# Patient Record
Sex: Female | Born: 1935 | Race: White | Hispanic: No | Smoking: Never smoker
Health system: Southern US, Community
[De-identification: ages and names within clinical notes are randomized; demographics above are authoritative.]

## PROBLEM LIST (undated history)

## (undated) DIAGNOSIS — G8929 Other chronic pain: Secondary | ICD-10-CM

## (undated) DIAGNOSIS — K5900 Constipation, unspecified: Secondary | ICD-10-CM

## (undated) DIAGNOSIS — F419 Anxiety disorder, unspecified: Secondary | ICD-10-CM

## (undated) DIAGNOSIS — M549 Dorsalgia, unspecified: Secondary | ICD-10-CM

## (undated) DIAGNOSIS — K219 Gastro-esophageal reflux disease without esophagitis: Secondary | ICD-10-CM

## (undated) HISTORY — PX: ABDOMINAL HYSTERECTOMY: SHX81

## (undated) HISTORY — PX: HERNIA REPAIR: SHX51

## (undated) HISTORY — DX: Other chronic pain: G89.29

## (undated) HISTORY — PX: HEMORRHOID SURGERY: SHX153

## (undated) HISTORY — DX: Gastro-esophageal reflux disease without esophagitis: K21.9

## (undated) HISTORY — PX: EYE SURGERY: SHX253

## (undated) HISTORY — DX: Anxiety disorder, unspecified: F41.9

## (undated) HISTORY — DX: Dorsalgia, unspecified: M54.9

## (undated) HISTORY — PX: LUMBAR DISC SURGERY: SHX700

---

## 2003-03-19 ENCOUNTER — Encounter: Payer: Self-pay | Admitting: Diagnostic Radiology

## 2003-03-19 ENCOUNTER — Encounter: Payer: Self-pay | Admitting: Neurosurgery

## 2003-03-19 ENCOUNTER — Encounter: Admission: RE | Admit: 2003-03-19 | Discharge: 2003-03-19 | Payer: Self-pay | Admitting: Neurosurgery

## 2003-04-05 ENCOUNTER — Encounter: Admission: RE | Admit: 2003-04-05 | Discharge: 2003-04-05 | Payer: Self-pay | Admitting: Neurosurgery

## 2003-04-05 ENCOUNTER — Encounter: Payer: Self-pay | Admitting: Neurosurgery

## 2003-07-06 ENCOUNTER — Encounter
Admission: RE | Admit: 2003-07-06 | Discharge: 2003-08-05 | Payer: Self-pay | Admitting: Physical Medicine and Rehabilitation

## 2004-12-05 ENCOUNTER — Encounter (INDEPENDENT_AMBULATORY_CARE_PROVIDER_SITE_OTHER): Payer: Self-pay | Admitting: *Deleted

## 2004-12-05 ENCOUNTER — Inpatient Hospital Stay (HOSPITAL_COMMUNITY): Admission: RE | Admit: 2004-12-05 | Discharge: 2004-12-18 | Payer: Self-pay | Admitting: Orthopedic Surgery

## 2004-12-12 ENCOUNTER — Ambulatory Visit: Payer: Self-pay | Admitting: Critical Care Medicine

## 2004-12-13 ENCOUNTER — Ambulatory Visit: Payer: Self-pay | Admitting: Physical Medicine & Rehabilitation

## 2004-12-18 ENCOUNTER — Inpatient Hospital Stay (HOSPITAL_COMMUNITY)
Admission: RE | Admit: 2004-12-18 | Discharge: 2004-12-28 | Payer: Self-pay | Admitting: Physical Medicine & Rehabilitation

## 2006-03-25 ENCOUNTER — Ambulatory Visit (HOSPITAL_COMMUNITY): Admission: RE | Admit: 2006-03-25 | Discharge: 2006-03-25 | Payer: Self-pay | Admitting: Ophthalmology

## 2007-11-19 HISTORY — PX: COLONOSCOPY: SHX174

## 2008-11-01 ENCOUNTER — Ambulatory Visit (HOSPITAL_COMMUNITY): Admission: RE | Admit: 2008-11-01 | Discharge: 2008-11-01 | Payer: Self-pay | Admitting: Ophthalmology

## 2009-05-09 ENCOUNTER — Ambulatory Visit (HOSPITAL_COMMUNITY): Admission: RE | Admit: 2009-05-09 | Discharge: 2009-05-09 | Payer: Self-pay | Admitting: Ophthalmology

## 2011-01-05 NOTE — Discharge Summary (Signed)
Belinda [name], Belinda [name] NO.:  [id number]   MEDICAL RECORD NO.:  [id number]          PATIENT TYPE:  INP   LOCATION:  5015                         FACILITY:  [facility]   PHYSICIAN:  Nelda Severe, MD      DATE OF BIRTH:  1927/02/17   DATE OF ADMISSION:  12/05/2004  DATE OF DISCHARGE:  12/18/2004                                 DISCHARGE SUMMARY   Patient was admitted for management of painful lumbar spondylosis associated  with spinal stenosis and scoliosis.  On the day of admission, she was taken  to the operating room where first stage L5-S1, L4-5 anterior inner body  fusion was carried out.  Postoperatively, her course was mildly complicated  by an ileus which resolved.  She was subsequently taken back to the  operating room one week later where a posterior instrumentation fusion was  performed from T10 to S1 with laminectomies, etc.  After that surgery, she  did satisfactorily, but was slow.  A consult was placed for inpatient rehab  and she was accepted in transfer.  She was transferred to rehab on Dec 18, 2004.  At that time, she was to be ambulating with a front-wheel walker,  weightbearing as tolerated.  Medications are to be determined by the  rehabilitation service.  Her wound was stable.  She is to be followed in the  office subsequent to her discharge from the rehabilitation service.       MT/MEDQ  D:  03/08/2005  T:  03/09/2005  Job:  [id number]

## 2011-01-05 NOTE — Op Note (Signed)
NAMEJIN, SHOCKLEY NO.:  [id number]   MEDICAL RECORD NO.:  [id number]          PATIENT TYPE:  INP   LOCATION:  2550                         FACILITY:  [facility]   PHYSICIAN:  Nelda Severe, MD      DATE OF BIRTH:  1926/11/15   DATE OF PROCEDURE:  12/05/2004  DATE OF DISCHARGE:                                 OPERATIVE REPORT   SURGEON:  Nelda Severe, M.D.   ASSISTANT:  Lynford Citizen, R.N.   PREOPERATIVE DIAGNOSIS:  Lumbar spondylosis, lumbar stenosis, lumbar  scoliosis.   POSTOPERATIVE DIAGNOSIS:  Lumbar spondylosis, lumbar stenosis, lumbar  scoliosis.   OPERATION:  First stage anterior interbody fusion at L4-5 and L5-S1  utilizing autograft, Synthes intravertebral cages, beta tricalcium  phosphate, and Synthes screws and washers; anterior iliac crest graft  harvest on left side.   OPERATIVE NOTE:  The patient was placed under general endotracheal  anesthesia. Sequential compression devices were attached to both lower  extremities. A Foley catheter was placed in the bladder. Preoperative Ancef  was infused intravenously.   The patient was positioned supine on a normal operating table with a bump  under the left hip to raise the left iliac crest. Arms were abducted and  placed on arm boards. The abdomen was prepped with Duraprep and draped in a  square fashion. The drapes were secured with Ioban.   An oblique incision was made from a point about 4 cm distal to the costal  margin proximally in oblique fashion to a point approximately 4 cm proximal  to the symphysis pubis and 3 cm lateral to the midline. Incision was carried  down onto the rectus sheath which was divided in a line with the incision.  The lateral edge of the rectus abdominus was mobilized. The arcuate line and  posterior rectus sheath were identified.   Blunt dissection was carried into the preperitoneal fat and then the  retroperitoneal fat distally to the arcuate line. Peritoneal sac was  then  bluntly dissected off the posterior rectus sheath. The posterior rectus  sheath was then incised approximately about 7 cm.   We then bluntly dissected and mobilized the peritoneal sac as well as the  left ureter which was visualized. The common iliac vessels were visualized  as well as the psoas muscle.   We then placed table mounted retractors (Synframe) and then bluntly  dissected soft tissue away from the sacral prominence within the bifurcation  of the great vessels. An 18-gauge needle was placed and proceeded to the L5-  S1 disk, and this was confirmed on a cross table lateral radiograph.   Next, after further mobilization of the right common iliac vessels,  retractors were placed to retain exposure of the L5-S1 annulus.   An annulectomy was performed and then disk enucleated using a combination of  curets and rongeurs. End-plate cartilage was curetted away from the inferior  end plate of L5 and superior end plate of S1. Trial spacers were placed and  a 17 spacer identified as being the best choice.   We then removed some of the retractors on  the left, exposed the anterior  iliac crest through a short incision. We windowed the top of the crest and  then using a combination of curets and a small gauge removed as much  cancellous bone as possible. We then mixed that bone with beta tricalcium  phosphate because we felt that there would not be enough bone for two  levels.   The wound was then irrigated. A slab of dry gel foam was placed in the  defect and the fascia closed using interrupted 0 Vicryl sutures.   Retractors were replaced at L5-S1. A 17-mm intervertebral spacer was loaded  with the mixture of bone and tricalcium phosphate and then impacted into  place. A Synthes 6.5-mm cancellous titanium screw with washer was then  inserted into the sacrum, placing it perpendicular to the floor so that the  washer would impinge upon the lower edge of the intervertebral  spacer.   Retractors were then removed and placed more proximally at the L5-S1 level.  The interval between the common iliac vessels and the psoas was blunted  dissected. A few small vessels were coagulated using bipolar coagulation.  The common iliac vessel was the mobilized to the right side and held with a  retractor blade to expose the anterior aspect of the L4-5 disk. The other  retractors were appropriately placed.   An annulectomy was then performed and the disk enucleated. This disk was  very narrow on the left side and of almost normal thickness on the right.  Therefore, the annulus was released on the right side to allow [place] to jack  it up.   Once we had removed the end-plate cartilage and enucleated the disk, a 15-mm  spacer seemed to best fit. This was purposely placed somewhat more to the  left than the right in order to correct the wedging of the disk space. It  was loaded with the remaining tricalcium phosphate and bone mixture and  impacted into place. Another door-stop screw was, again 3 mm in length,  6.5-mm cancellous fully threaded Synthes screw was placed proximally into  the L5 body so that the washer impinged upon the cage.   Cross table radiographs showed satisfactory position of implants. Closure  was then effected using continuous 2-0 Vicryl suture in the posterior rectus  sheath, continuous #1 Vicryl in the anterior rectus sheath, a combination of  interrupted and continuous 2-0 Vicryl suture in the subcutaneous layer, and  a subcuticular 3-0 Vicryl suture in the skin. A nonadherent antibiotic  ointment dressing was then applied to the wound. The closure of the iliac  crest bone graft harvest site was similar and antibiotic ointment dressing  applied there also.   The patient was moved to her bed and awakened. Upon dictation, she is being  taken to the recovery room.   There were no intraoperative complications. Sponge and needle counts were correct. The  blood estimated by the anesthesiology team at 400 cc.   Only 200 cc was sucked into the cell saver, so there was no cell saver blood  returned.    MT/MEDQ  D:  12/05/2004  T:  12/05/2004  Job:  [id number]

## 2011-01-05 NOTE — Op Note (Signed)
[name], [name] NO.:  [id number]   MEDICAL RECORD NO.:  [id number]          PATIENT TYPE:  INP   LOCATION:  2105                         FACILITY:  [facility]   PHYSICIAN:  Nelda Severe, MD      DATE OF BIRTH:  [birth year]   DATE OF PROCEDURE:  12/12/2004  DATE OF DISCHARGE:                                 OPERATIVE REPORT   SURGEON:  Nelda Severe, MD   ASSISTANT:  Belinda Merles, PA student.   PREOPERATIVE DIAGNOSES:  1.  Lumbar spondylosis.  2.  Lumbar stenosis.  3.  Lumbar degenerative scoliosis.  4.  Status post first stage anterior L4-5, L5-S1 fusion.   POSTOPERATIVE DIAGNOSES:  1.  Lumbar spondylosis.  2.  Lumbar stenosis.  3.  Lumbar degenerative scoliosis.  4.  Status post first stage anterior L4-5, L5-S1 fusion.   OPERATIVE PROCEDURE:  Posterior instrumentation and fusion T10 to S1using  Synthes Click-X instrumentation, autograft, and beta tricalcium phosphate;  right laminectomy at L2-3, bilateral laminectomies L3-4 and L4-5.   OPERATIVE NOTE:  The patient was placed under general endotracheal  anesthesia.  Foley catheter was already in the bladder.  A gram of  vancomycin was infused intravenously.  Bilateral sequential compression  devices were attached to both lower extremities.  Electrodes for neurologic  monitoring were attached.   She was positioned prone on four-poster Acromed frame.  Care was taken to  pad the upper extremities and shoulders to make sure that the shoulders were  not hyperabducted or hyperflexed and to make sure the elbows were not  hyperflexed.  The hips and knees were gently flexed and padded with pillows.   The lumbar area was prepped with DuraPrep and draped in rectangular fashion.  The drapes were secured with Ioban.   A midline incision was made from the lower thoracic spine to the sacrum.  The subcutaneous tissue was injected with a mixture of 0.25% Marcaine with  epinephrine and 1% plain lidocaine.   Incision was carried down into the spinous processes with a hot knife.  The  paraspinal muscles were mobilized with hot knife and Cobb elevators from T9  to S2.  Localizing lateral radiograph was taken with a Kocher on the spinous  process of L4.  It should be noted there were a great deal of problems  getting technically adequate x-rays because accommodation of the patient  being osteoporotic and her rather large girth.   The lumbar area was exposed bilaterally to the tips of the transverse  processes of L1 through L5 and the ala of the sacrum.   We then made pedicle holes bilaterally from S1 distally to T10 proximally.  Radiopaque markers were placed in each pedicle hole, and cross-table lateral  radiograph was taken.  This showed left side, 2-3 holes had been angulated  too per proximally, and this was corrected at the time the screws were  placed.  Each pedicle hole was carefully palpated with a ball tip probe to  make sure it was circumferentially intact and sounded for depth and the  depths recorded.  The holes were sealed  with FloSeal and bone wax around the  radiopaque marker.   Next we performed the laminectomy.  First we harvested bone graft from the  lamina using an osteotome.  Then the laminectomies were performed  bilaterally at L4 and L3 using a combination of Kerrison and Leksell  rongeurs.  Inferior facetectomies were carried out at L3 and L4.  Laminectomy was extended distally into L5 to a point approximately at the  level of approximately the mid pedicle level of L5.  The L5 nerve roots  could be palpated bilaterally descending into the neural foramen and did not  appear to be under undue tension.   Next, decortication of the ilio sacrum bilaterally and transverse processes  of L5, L4, L3, and L2 were carried out.  The bone graft which had been saved  from the laminectomy was mixed with 20 mL of beta tricalcium phosphate and  20 mL of bone marrow aspirate.  This was  then packed in bilaterally from L2  transverse process distally to the sacrum.   Actually prior to placing the bone graft, Click-X screws were placed  bilaterally at S1, L5, L4, L3, L2, L1, T11, and T12.  In the thoracic spine,  5.2 mm diameter screws were used, in the upper lumbar spine 6.2 mm diameter  screws were used and in the lower lumbar spine 7 mm screws.  On the right  side of the sacrum, 8 mm screw was used in the sacrum and on the left, a 7.2  mm screw.   We then measured, cut, and contoured titanium rods and attached them to the  screws.  Cross-table radiograph was taken.  Screws appeared in satisfactory  position.  It appeared on one of the x-rays as though one of the titanium  screws might be protruding into the T9-10 disk; it was removed, the hole  palpated and found not to be extending into the disk.  The screw was  reinserted.   The couplings were then torqued to the rods at each level .   The one-eighth inch Hemovac drain was placed in the deep depths of the wound  and brought out through the skin to the right side.  Closure was then  effected in the thoracolumbar fascia using continuous #1 Vicryl suture.  A  subcutaneous drain was placed, one-eighth inch Hemovac, brought out through  the skin to the right also.  Subcutaneous layer was closed using interrupted  2-0 inverted simple sutures.  Skin was closed using a subcuticular 3-0  undyed Vicryl and the skin edges reinforced with Steri-Strips.  Antibiotic  ointment dressing was applied and secured with OpSite.  Prior to placing the  dressing, each Hemovac drain was secured with a second basket-weave-type #2-  0 nylon suture.   The blood loss estimated at 2200 mL.  There was a significant amount of bone  bleeding throughout the procedure as well as epidural bleeding.  The patient  received 4 units of fresh frozen plasma, 1 unit of platelets, 1100 mL of Cell Saver blood, and 4 units of packed red blood cells.  At the  time of  closure, the active bleeding appeared to have stopped, and the blood in the  wound was coagulating satisfactorily.   The patient was then taken off the table and placed in her bed.  She was  extremely swollen around her face and scalp and as well, her abdomen was  quite distended, presumably from transudation into the peritoneal cavity.   She is  to remain intubated overnight and at the time of dictation has been  taken to the medical ICU, although I have not yet reexamined her there.   There were no intraoperative complications.  The sponge and needle counts  were correct.      MT/MEDQ  D:  12/12/2004  T:  12/12/2004  Job:  [id number]

## 2011-01-05 NOTE — Consult Note (Signed)
NAMEDOLOREZ, JEFFREY NO.:  [id number]   MEDICAL RECORD NO.:  [id number]          PATIENT TYPE:  INP   LOCATION:  2105                         FACILITY:  [facility]   PHYSICIAN:  Shan Levans, M.D. LHCDATE OF BIRTH:  September 19, 1926   DATE OF CONSULTATION:  12/12/2004  DATE OF DISCHARGE:                                   CONSULTATION   CHIEF COMPLAINT:  Evaluate for respiratory failure, status post surgery.   HISTORY OF PRESENT ILLNESS:  This 77 year old white female was brought in  electively for degenerative joint disease and a lumbar spondylosis, lumbar  stenosis, and scoliosis on December 05, 2004. Initially, she had an anterior  approach with first stage interbody fusion of L4/L5 and L5-S1 with Autograft  and intervertebral cages and screws. Then she had recovery from this and was  brought back to the OR today on December 12, 2004 and underwent posterior  fusion from T10 to S1 with laminectomy screws, rods, and Autograft using a  posterior approach. Postoperatively, she is on the ventilator. She has a  shocky blood pressures and low oxygen. We were asked to assist with ICU care  postoperatively.   PAST MEDICAL HISTORY:  1.  Hysterectomy.  2.  Cataracts.  3.  Degenerative joint disease.  4.  No other significant history and in particular no history of      cardiopulmonary disease.   MEDICATIONS:  Evista q.d. and aspirin 81 mg q.d.   PHYSICAL EXAMINATION:  VITAL SIGNS:  Blood pressure 80/palpable, temperature  100.3, pulse 85.  CHEST:  Expired wheezes. Poor air movement.  CARDIOVASCULAR:  Tachycardia without S3.  ABDOMEN:  Distended. Bowel sounds hypoactive.  EXTREMITIES:  Cool perfused.  SKIN:  Clear.  HEENT:  Orally intubated.  NECK:  No jugular venous distention.  No lymphadenopathy.   LABORATORY DATA:  A pH of 7.32, pCO2 of 53, pO2 160 on 100% with rate of 12,  PEEP of 5, tidal volume 550. Other labs are pending postoperatively. The  patient is on Diprivan  drip.   Chest x-ray shows atelectasis. Endotracheal tube is in satisfactory  position.   IMPRESSION:  Status post multi-staged posterior approach back fusion with  laminectomy screws and rods. Autograft placement with significant blood loss  with postoperative shock and respiratory failure. History of chronic back  disease, scoliosis.   RECOMMENDATIONS:  Administer full ventilatory support. Follow-up CBC, PTT,  and BNP. Give volume in the way of pack cells for now. Administer Diprivan  for sedation. Extubate in 24 hours if stabilizes over the night.      PW/MEDQ  D:  12/12/2004  T:  12/12/2004  Job:  [id number]   cc:   Nelda Severe, MD  Fax: [phone]   Patient's chart

## 2011-01-05 NOTE — Discharge Summary (Signed)
NAMECLEOPHA, INDELICATO NO.:  [id number]   MEDICAL RECORD NO.:  [id number]          PATIENT TYPE:  IPS   LOCATION:  4006                         FACILITY:  [facility]   PHYSICIAN:  Ellwood Dense, M.D.   DATE OF BIRTH:  11/23/1926   DATE OF ADMISSION:  12/18/2004  DATE OF DISCHARGE:  12/28/2004                                 DISCHARGE SUMMARY   DISCHARGE DIAGNOSES:  1.  Lumbar spondylosis with left scoliosis and L2 to L4 stenosis requiring      L2 to L4 posterior fusion and L4 to S1 anterior fusion.  2.  Postoperative anemia.  3.  Hypoalbuminemia.  4.  Urinary tract infection.  5.  Cellulitis, left lower abdomen, resolved.   HISTORY OF PRESENT ILLNESS:  Belinda [name] is a 77 year old female with history  of back and left lower extremity pain for the past 3 years with progression  and problems with ambulation.  She was noted to have lumbar spondylosis with  left scoliosis and L2 to L4 stenosis.  The patient elected to undergo L5 to  S1 fusion with autograft and Synthes cages, left iliac crest graft on December 05, 2004 by Dr. Carolynne Edouard.  Postop, noted to have anemia, requiring 3 units of  packed red blood cells.  On December 12, 2004, the patient underwent second  stage of surgery, requiring T10 to  S1 fusion with L3 to L5 laminectomies  with screws and rods, also by Dr. Carolynne Edouard.  Postop, patient with hypoxia and  hypotension due to shock, requiring intubation from December 12, 2004 to December 14, 2004.  She was extubated without difficulty.  Other issues have included  problems with electrolyte abnormality including hypokalemia as well as fluid  overload requiring treatment with diuresis.  She was also noted to have  ileus that resolved.  Foley discontinued on December 16, 2004 and patient noted  to be voiding without difficulty.  Therapies were initiated and patient  currently at mod-assist for bed mobility, min-assist for transfers, able to  min-assist, ambulating 100 feet, noted to have  decrease in endurance and  decrease in mobility.  She was setup assist for upper body care, max-assist  for lower body care.   PAST MEDICAL HISTORY:  Past medical history is significant for:  1.  DJD.  2.  DDD.  3.  Hysterectomy.  4.  Excision of bilateral cataracts.  5.  Excision of bunions.   ALLERGIES:  No known drug allergies.   FAMILY HISTORY:  Family history is positive for diabetes and cancer.   SOCIAL HISTORY:  The patient lives alone in a 1-level home with 1 step at  entry and was independent prior to admission.  She does not use any tobacco  or alcohol.   HOSPITAL COURSE:  Ms. Belinda [name] was admitted to rehab on Dec 18, 2004 for  inpatient therapies to consist of PT and OT daily.  Past admission, subcu  Lovenox was added for DVT prophylaxis secondary to the patient's poor  mobility.  The patient was noted to have a large back incision that was  noted  to be healing well.  She was noted to have 2 abdominal incisions in  left lower quadrant; the larger abdominal incision was noted to be indurated  with erythema and positive bloody drainage.  The patient was started on  Keflex for wound prophylaxis as question of early cellulitis.  The patient  was also started on iron supplements for postop anemia.  PVRs were checked  secondary to some complaints of frequency and the patient was noted to be  emptying without difficulty.  Her UA/UCS were done on Dec 19, 2004 showing  100,000 colonies, however, multi-species.  Labs done on May __________, 2006  revealed hemoglobin 9.1, hematocrit 26.4, white count 11.4, platelets  528,000.  Check of electrolytes revealed sodium of 139, potassium 3.2,  chloride 99, CO2 31, BUN 6, creatinine 0.8.  The patient was started on some  potassium supplement.  Recheck of electrolytes from Dec 26, 2004 shows  hypokalemia to have resolved with sodium 139, potassium 3.6, chloride 103,  CO2 28, BUN 6, creatinine 0.7, glucose 108.  Followup on postop  anemia  showed some improvement with hemoglobin at 9.4, hematocrit 26.0.  The  patient has been continent of bowel and bladder.  Pain control has been  reasonable with the use of p.r.n. oxycodone.  The patient was continued on  Keflex for 7 total days of treatment.  The patient's left lower quadrant  incision has healed well without any erythema.  She does continue with some  induration and some areas of ischemic tissue medially.  Repeat UA/UC were  done on Dec 25, 2004 and this grew out 40,000 colonies of E coli and Proteus,  patient to be treated with 3-day course of Cipro.  The patient has been  progressing along well during her stay.  She is currently at supervision  level for ambulating 250 feet, supervision to min-assist for transfers,  supervision to min-assist for toileting, modified independent for upper body  care, min-assist for lower body care.  She will continue to receive followup  home health PT by [facility] past discharge; family is to  assist as needed.  On Dec 28, 2004, the patient is discharged to home.   DISCHARGE MEDICATIONS:  1.  Nexium 40 mg a day.  2.  Senna-S two p.o. b.i.d.  3.  ProMod one scoop b.i.d.  4.  Trinsicon one p.o. b.i.d.  5.  Oxycodone IR 5 to 10 mg q.4-6 h. p.r.n. pain.  6.  MiraLax 17 g p.o. per day.   DIET:  Regular.   WOUND CARE:  Wash area with soap and water.  Keep clean and dry.   SPECIAL INSTRUCTIONS:  Routine back precautions.  Don brace at edge of bed.   FOLLOWUP:  The patient is to follow up with Dr. Carolynne Edouard in the next 1-2 week,  follow up with Dr. Chalmers Guest for routine care, follow up with Dr. Lamar Benes  as needed.      PP/MEDQ  D:  12/27/2004  T:  12/27/2004  Job:  [id number]   cc:   Dr. Virginia Rochester S. Vernell Morgans, M.D.  [address], [address]  [place]  [place] [address]

## 2011-01-05 NOTE — Consult Note (Signed)
NAME:  KAMI, KUBE               ACCOUNT NO.:  [id number]   MEDICAL RECORD NO.:  [id number]          PATIENT TYPE:  INP   LOCATION:  5009                         FACILITY:  [facility]   PHYSICIAN:  Theone Stanley, MD   DATE OF BIRTH:  [birth year]   DATE OF CONSULTATION:  DATE OF DISCHARGE:                                   CONSULTATION   REASON FOR CONSULTATION:  Help with medical management.   REQUESTING PHYSICIAN:  Nelda Severe, M.D.   CHIEF COMPLAINT:  Back pain.   Ms. Gilcrest is a very pleasant 77 year old female with minimal medical  problems presenting with increasing back pain and inability to walk.  She  has had back pain for the past 13 years; however, it has become  progressively worse over the past three years to the point where she can  only walk 100 feet.  She has tried conservative management, including  injections, but this has not helped.  In addition, she had symptoms of left  leg numbness for the past couple of years.  Because of this, she was  referred to the orthopedic physician, Dr. Alveda Reasons, and because she is in good  health with minimal medical problems, it was decided to proceed with her  surgery.  The patient recently had a first-stage anterior interbody fusion  at L4-5 and L5-S1 utilizing autograft, intervertebral cages.  This was  performed on April 18.  She is subsequently going to have further surgery  tomorrow on the 25th by Dr. Alveda Reasons, the second stage of her surgery.  Postoperatively it was complicated by bleeding to a hemoglobin of 7.9 and  also ileus secondary to narcotics.  These issues have pretty much resolved;  however, because of these postoperative complications, Dr. Alveda Reasons felt it  would be pertinent to get general medicine involved to help with her  postoperative management.  Currently the patient is doing well.  She has no  complaints.  Her pain is well-managed on Percocet.   PHYSICAL EXAMINATION:  VITAL SIGNS:  Temperature of 98.6, pulse of  105,  respirations 20, systolic blood pressure of 145/75, saturating 98% on room  air.  HEENT:  Head atraumatic, normocephalic.  Eyes 3 mm, pupils reactive to  light.  Extraocular movements intact.  Ears with no discharge.  Throat  clear. mucosa moist.  NECK:  Supple, no lymphadenopathy, no JVD.  CARDIAC:  Regular rate and rhythm.  No murmurs, rubs or gallops heard.  CHEST:  Lungs clear to auscultation bilaterally.  ABDOMEN:  Soft, nontender, nondistended.  EXTREMITIES:  No edema, cyanosis or clubbing.  NEUROLOGIC:  Patient alert and oriented x3.  Strength 5/5.  SKIN:  No rashes.  GENITOURINARY:  Deferred.   LABORATORY DATA:  A white count of 8, hemoglobin of 10, hematocrit 31,  platelets at 286.  INR 1.3.  Sodium 137, potassium of 3.2, chloride at 105,  CO2 of 25, glucose at 112, BUN at 5, creatinine at 0.8, calcium at 7.5.   ASSESSMENT AND PLAN:  Ms. Calip is a 77 year old Caucasian female  presenting to the hospital for a multistage surgery for spinal  stenosis.  Tomorrow will be her second stage.   1.  Anemia, and this is secondary to her recent surgery.  She has been      transfused, and she is ready for her surgery tomorrow.  Will continue to      watch this postoperatively.  2.  Hypokalemia.  The patient is receiving supplementation for this, and      will watch this with a repeat BMET tomorrow.  3.  Postoperative ileus.  This is definitely secondary to narcotics.  Things      to do would be decrease the dosage or consider alternative like Nubain      or Ultram.      AEJ/MEDQ  D:  12/11/2004  T:  12/12/2004  Job:  [id number]   cc:   [address]  [place]  [place] [address]  Fax: [phone]   Nelda Severe, MD  Fax: [phone]

## 2012-11-26 ENCOUNTER — Encounter (INDEPENDENT_AMBULATORY_CARE_PROVIDER_SITE_OTHER): Payer: Self-pay | Admitting: Surgery

## 2012-11-27 ENCOUNTER — Other Ambulatory Visit (INDEPENDENT_AMBULATORY_CARE_PROVIDER_SITE_OTHER): Payer: Self-pay | Admitting: Surgery

## 2012-11-27 ENCOUNTER — Ambulatory Visit (INDEPENDENT_AMBULATORY_CARE_PROVIDER_SITE_OTHER): Payer: Medicare Other | Admitting: Surgery

## 2012-11-27 ENCOUNTER — Encounter (INDEPENDENT_AMBULATORY_CARE_PROVIDER_SITE_OTHER): Payer: Self-pay | Admitting: Surgery

## 2012-11-27 VITALS — BP 128/80 | HR 68 | Temp 98.2°F | Resp 14 | Ht 68.0 in | Wt 168.4 lb

## 2012-11-27 DIAGNOSIS — K4090 Unilateral inguinal hernia, without obstruction or gangrene, not specified as recurrent: Secondary | ICD-10-CM

## 2012-11-27 DIAGNOSIS — K439 Ventral hernia without obstruction or gangrene: Secondary | ICD-10-CM | POA: Insufficient documentation

## 2012-11-27 DIAGNOSIS — R1012 Left upper quadrant pain: Secondary | ICD-10-CM

## 2012-11-27 NOTE — Progress Notes (Signed)
Patient ID: Belinda [name], female   DOB: 09-22-1926, 79 y.o.   MRN: [id number]  Chief Complaint  Patient presents with  . Hernia    HPI Belinda [name] is a 79 y.o. female.  Referred by Dr. Samuel Jester for left inguinal hernia HPI This is an 79 year old female in recently good health who is status post extensive spinal surgery by Dr. Alveda Reasons about 8 years ago. She had a posterior approach as well as a anterior retroperitoneal approach through a left lower quadrant incision. She has a previous midline incision from a hysterectomy. Over the last several months she has developed a fairly large bulge in her left lower quadrant. This has caused some discomfort and some difficulties with bowel movements. This area remains reducible when she is supine. She was examined by Dr. Charm Barges who referred her for possible left inguinal hernia.   PMH GERD, Chronic back pain   Past Surgical History  Procedure Laterality Date  . Hernia repair    . Lumbar disc surgery    . Abdominal hysterectomy    . Cesarean section    . Hemorrhoid surgery      History reviewed. No pertinent family history.  Social History History  Substance Use Topics  . Smoking status: Never Smoker   . Smokeless tobacco: Not on file  . Alcohol Use: No    No Known Allergies  Current Outpatient Prescriptions  Medication Sig Dispense Refill  . celecoxib (CELEBREX) 200 MG capsule Take 200 mg by mouth 2 (two) times daily.      Marland Kitchen esomeprazole (NEXIUM) 40 MG capsule Take 40 mg by mouth daily before breakfast.      . lactulose (CEPHULAC) 20 G packet Take 20 g by mouth 3 (three) times daily.      Marland Kitchen LORazepam (ATIVAN) 0.5 MG tablet Take 0.5 mg by mouth every 8 (eight) hours.      Marland Kitchen oxyCODONE-acetaminophen (PERCOCET) 10-325 MG per tablet Take 1 tablet by mouth every 4 (four) hours as needed for pain.      Marland Kitchen rOPINIRole (REQUIP) 0.5 MG tablet Take 0.5 mg by mouth 3 (three) times daily.      Marland Kitchen zolpidem (AMBIEN) 10 MG tablet Take 10 mg  by mouth at bedtime as needed for sleep.       No current facility-administered medications for this visit.    Review of Systems Review of Systems  Constitutional: Negative for fever, chills and unexpected weight change.  HENT: Negative for hearing loss, congestion, sore throat, trouble swallowing and voice change.   Eyes: Negative for visual disturbance.  Respiratory: Negative for cough and wheezing.   Cardiovascular: Negative for chest pain, palpitations and leg swelling.  Gastrointestinal: Positive for abdominal pain, constipation and abdominal distention. Negative for nausea, vomiting, diarrhea, blood in stool and anal bleeding.  Genitourinary: Negative for hematuria, vaginal bleeding and difficulty urinating.  Musculoskeletal: Negative for arthralgias.  Skin: Negative for rash and wound.  Neurological: Negative for seizures, syncope and headaches.  Hematological: Negative for adenopathy. Does not bruise/bleed easily.  Psychiatric/Behavioral: Negative for confusion.    Blood pressure 128/80, pulse 68, temperature 98.2 F (36.8 C), temperature source Temporal, resp. rate 14, height 5\' 8"  (1.727 m), weight 168 lb 6.4 oz (76.386 kg).  Physical Exam Physical Exam WDWN in NAD HEENT:  EOMI, sclera anicteric Neck:  No masses, no thyromegaly Lungs:  CTA bilaterally; normal respiratory effort CV:  Regular rate and rhythm; no murmurs Abd:  +bowel sounds, soft, healed lower midline and  LLQ oblique incision.  Visible palpable bulge between the incisions.  Easily reducible; no palpable fascial edges Ext:  Well-perfused; no edema Skin:  Warm, dry; no sign of jaundice  Data Reviewed none  Assessment    LLQ abdominal wall bulge - possible ventral hernia vs laxity of abdominal musculature secondary to nerve or blood supply disruption.     Plan    CT scan of the abdomen and pelvis with contrast to evaluate abdominal wall - hernia vs denervated muscle Will reevaluate after  scan Recommend abdominal binder for comfort        Evellyn Tuff K. 11/27/2012, 9:34 AM

## 2012-12-05 ENCOUNTER — Ambulatory Visit
Admission: RE | Admit: 2012-12-05 | Discharge: 2012-12-05 | Disposition: A | Discharge status: Home / Self Care | Payer: Medicare Other | Source: Ambulatory Visit | Attending: Surgery | Admitting: Surgery

## 2012-12-05 DIAGNOSIS — R1012 Left upper quadrant pain: Secondary | ICD-10-CM

## 2012-12-05 DIAGNOSIS — K4090 Unilateral inguinal hernia, without obstruction or gangrene, not specified as recurrent: Secondary | ICD-10-CM

## 2012-12-05 MED ORDER — IOHEXOL 300 MG/ML  SOLN
100.0000 mL | Freq: Once | INTRAMUSCULAR | Status: AC | PRN
  Administered 2012-12-05: 100 mL via INTRAVENOUS

## 2012-12-15 ENCOUNTER — Encounter (INDEPENDENT_AMBULATORY_CARE_PROVIDER_SITE_OTHER): Payer: Medicare Other | Admitting: Surgery

## 2012-12-30 ENCOUNTER — Encounter (INDEPENDENT_AMBULATORY_CARE_PROVIDER_SITE_OTHER): Payer: Self-pay | Admitting: Surgery

## 2012-12-30 ENCOUNTER — Ambulatory Visit (INDEPENDENT_AMBULATORY_CARE_PROVIDER_SITE_OTHER): Payer: Medicare Other | Admitting: Surgery

## 2012-12-30 VITALS — BP 137/84 | HR 76 | Temp 98.1°F | Resp 16 | Ht 68.0 in | Wt 165.8 lb

## 2012-12-30 DIAGNOSIS — R1900 Intra-abdominal and pelvic swelling, mass and lump, unspecified site: Secondary | ICD-10-CM | POA: Insufficient documentation

## 2012-12-30 NOTE — Progress Notes (Signed)
The patient returns for followup of her CT scan. This showed no abnormality of the internal organs. She does have chronic atrophy of her left rectus abdominous muscle with bulging of the musculature in her left lower quadrant. There is no sign of ventral hernia in this area. She does have a small inguinal hernia on the left but this is not the area of discomfort.    The patient has been wearing an abdominal binder and really likes however feels. It is supporting her abdominal wall as well as her back. She does have some chronic constipation from her chronic Percocet use.  There are no surgical remedies for the atrophic muscle. She is doing well with the abdominal binder and should continue this. She may use a stool softener as needed for constipation. She already does take lactulose daily. She is due for a colonoscopy soon. Followup as needed.  Belinda [name]. Corliss Skains, MD, [facility]  General/ Trauma Surgery  12/30/2012 9:39 AM

## 2013-12-10 ENCOUNTER — Encounter: Payer: Self-pay | Admitting: *Deleted

## 2014-01-13 ENCOUNTER — Ambulatory Visit (INDEPENDENT_AMBULATORY_CARE_PROVIDER_SITE_OTHER): Payer: Medicare Other | Admitting: Gastroenterology

## 2014-01-13 ENCOUNTER — Encounter (INDEPENDENT_AMBULATORY_CARE_PROVIDER_SITE_OTHER): Payer: Self-pay

## 2014-01-13 ENCOUNTER — Encounter: Payer: Self-pay | Admitting: Gastroenterology

## 2014-01-13 VITALS — BP 124/74 | HR 74 | Temp 97.3°F | Ht 68.0 in | Wt 173.0 lb

## 2014-01-13 DIAGNOSIS — K5900 Constipation, unspecified: Secondary | ICD-10-CM | POA: Insufficient documentation

## 2014-01-13 LAB — CBC
HEMATOCRIT: 36 %
HGB: 12 g/dL

## 2014-01-13 MED ORDER — LUBIPROSTONE 24 MCG PO CAPS: 24.0000 ug | ORAL_CAPSULE | Freq: Two times a day (BID) | ORAL | Status: DC

## 2014-01-13 NOTE — Patient Instructions (Signed)
I would like for you to start taking Amitiza 1 gelcap each evening WITH FOOD for 3 nights. If you do well with this, increase to twice a day WITH FOOD. It must be taken with food to avoid nausea.   I will see you back in 3 months to see how you are doing! Let me know if you have any concerns in the meantime.

## 2014-01-13 NOTE — Assessment & Plan Note (Signed)
80 year old female with chronic constipation and very scant hematochezia in the setting of known internal hemorrhoids; last colonoscopy in 2009 by Dr. Anthony Sar without polyps. Patient and daughter both deny any family history of colon cancer. She is quite healthy at her age; daughter desires a conservative approach currently. I feel it is definitely appropriate to proceed with treatment of constipation and monitoring for any worsening of symptoms or further hematochezia. If no improvement, consider colonoscopy. Otherwise, will continue to monitor and treat with bowel regimen. Constipation likely secondary to opiod-induced.   Amitiza 24 mcg po BID  Return in 3 months Consider colonoscopy if no improvement

## 2014-01-13 NOTE — Progress Notes (Signed)
cc'd to pcp 

## 2014-01-13 NOTE — Progress Notes (Signed)
Primary Care Physician:  Octavio Graves, DO Primary Gastroenterologist:  Dr. Oneida Alar   Chief Complaint  Patient presents with  . Colonoscopy    HPI:   Belinda [name] presents today at the request of Dr. Melina Copa to discuss possibly colonoscopy. Last colonoscopy in 2009 with internal and external hemorrhoids, otherwise normal. First ever. There was a question of possible family history of colon cancer, but both patient and daughter deny this. They state patient's mother had stomach cancer, but no one had ever had colon cancer in their family.  Sometimes scant hematochezia with wiping. Notes chronic constipation. Has cramping/pain in lower abdomen after taking Lactulose. Takes every other day. Takes Lactulose and dulcolax for constipation. No other prescription medications for constipation. No unexplained weight loss. Appetite not the best for 9 years since back surgery. States she is in constant pain from back issues. No upper GI concerns. Daughter does not want patient to pursue colonoscopy if not necessary.   Past Medical History  Diagnosis Date  . GERD (gastroesophageal reflux disease)   . Chronic back pain   . Anxiety     Past Surgical History  Procedure Laterality Date  . Hernia repair    . Lumbar disc surgery    . Abdominal hysterectomy    . Cesarean section    . Hemorrhoid surgery    . Colonoscopy  April 2009    Dr. Anthony Sar: internal and external hemorrhoids, otherwise normal    Current Outpatient Prescriptions  Medication Sig Dispense Refill  . Cholecalciferol (VITAMIN D-3) 5000 UNITS TABS Take 5,000 Units by mouth daily.      Marland Kitchen esomeprazole (NEXIUM) 40 MG capsule Take 40 mg by mouth daily before breakfast.      . lactulose (CEPHULAC) 20 G packet Take 20 g by mouth as needed.       Marland Kitchen LORazepam (ATIVAN) 0.5 MG tablet Take 0.5 mg by mouth every 8 (eight) hours.      Marland Kitchen oxyCODONE-acetaminophen (PERCOCET) 10-325 MG per tablet Take 1 tablet by mouth every 4 (four) hours  as needed for pain.      Marland Kitchen zolpidem (AMBIEN) 10 MG tablet Take 10 mg by mouth at bedtime as needed for sleep.      Marland Kitchen lubiprostone (AMITIZA) 24 MCG capsule Take 1 capsule (24 mcg total) by mouth 2 (two) times daily with a meal.  60 capsule  3   No current facility-administered medications for this visit.    Allergies as of 01/13/2014  . (No Known Allergies)    Family History  Problem Relation Age of Onset  . Colon cancer Neg Hx   . Stomach cancer Mother     History   Social History  . Marital Status: Widowed    Spouse Name: N/A    Number of Children: N/A  . Years of Education: N/A   Occupational History  . Not on file.   Social History Main Topics  . Smoking status: Never Smoker   . Smokeless tobacco: Current User  . Alcohol Use: No  . Drug Use: No  . Sexual Activity: Not on file   Other Topics Concern  . Not on file   Social History Narrative  . No narrative on file    Review of Systems: Gen: see HPI CV: Denies chest pain, heart palpitations, peripheral edema, syncope.  Resp: Denies shortness of breath at rest or with exertion. Denies wheezing or cough.  GI: see HPI GU : Denies urinary burning, urinary frequency,  urinary hesitancy MS: +chronic back pain Derm: Denies rash, itching, dry skin Psych: +anxiety Heme: Denies bruising, bleeding, and enlarged lymph nodes.  Physical Exam: BP 124/74  Pulse 74  Temp(Src) 97.3 F (36.3 C) (Oral)  Ht 5\' 8"  (1.727 m)  Wt 173 lb (78.472 kg)  BMI 26.31 kg/m2 General:   Alert and oriented. Pleasant and cooperative. Well-nourished and well-developed.  Head:  Normocephalic and atraumatic. Eyes:  Without icterus, sclera clear and conjunctiva pink.  Ears:  Normal auditory acuity. Nose:  No deformity, discharge,  or lesions. Mouth:  No deformity or lesions, oral mucosa pink.  Lungs:  Clear to auscultation bilaterally. No wheezes, rales, or rhonchi. No distress.  Heart:  S1, S2 present without murmurs appreciated.    Abdomen:  +BS, soft, non-tender and non-distended. No HSM noted. Right lower quadrant ventral hernia noted from prior anterior approach for lumbar surgery Rectal:  Deferred  Msk:  Symmetrical without gross deformities. Normal posture. Extremities:  Without clubbing or edema. Neurologic:  Alert and  oriented x4;  grossly normal neurologically. Skin:  Intact without significant lesions or rashes. Psych:  Alert and cooperative. Normal mood and affect.   Outside labs April 2015:  Hgb 12, Hct 36.4, LFTs normal

## 2014-01-27 ENCOUNTER — Telehealth: Payer: Self-pay | Admitting: Gastroenterology

## 2014-01-27 MED ORDER — LINACLOTIDE 145 MCG PO CAPS: 145.0000 ug | ORAL_CAPSULE | Freq: Every day | ORAL | Status: DC

## 2014-01-27 NOTE — Telephone Encounter (Signed)
Pt seen AS recently and said that the medicine that she put her on is making her sick and she needs to speak with AS or nurse ASAP. Please advise! [phone]

## 2014-01-27 NOTE — Addendum Note (Signed)
Addended by: Orvil Feil on: 01/27/2014 01:33 PM   Modules accepted: Orders

## 2014-01-27 NOTE — Telephone Encounter (Signed)
I called and told pt. She does not want samples , she wants the prescription for Linzess sent to her pharmacy. She said she will start it and call next week and let us know how she is doing.

## 2014-01-27 NOTE — Telephone Encounter (Signed)
Insurance may require a PA, but I'm not sure. I sent it in. If it does, may need to start on samples in the meantime.

## 2014-01-27 NOTE — Telephone Encounter (Signed)
Likely med effect from [place]. Stop Amitiza.  Let's trial Linzess 145 mcg daily. May cause diarrhea. PR early next week.

## 2014-03-23 ENCOUNTER — Encounter (INDEPENDENT_AMBULATORY_CARE_PROVIDER_SITE_OTHER): Payer: Self-pay

## 2014-03-23 ENCOUNTER — Ambulatory Visit (INDEPENDENT_AMBULATORY_CARE_PROVIDER_SITE_OTHER): Payer: Medicare Other | Admitting: Gastroenterology

## 2014-03-23 ENCOUNTER — Encounter: Payer: Self-pay | Admitting: Gastroenterology

## 2014-03-23 VITALS — BP 128/73 | HR 82 | Temp 97.0°F | Resp 18 | Ht 68.0 in | Wt 175.0 lb

## 2014-03-23 DIAGNOSIS — K921 Melena: Secondary | ICD-10-CM

## 2014-03-23 DIAGNOSIS — K5900 Constipation, unspecified: Secondary | ICD-10-CM

## 2014-03-23 MED ORDER — PEG 3350-KCL-NA BICARB-NACL 420 G PO SOLR: 4000.0000 mL | ORAL | Status: DC

## 2014-03-23 MED ORDER — POLYETHYLENE GLYCOL 3350 17 GM/SCOOP PO POWD: ORAL | Status: DC

## 2014-03-23 NOTE — Patient Instructions (Signed)
Take 1 capful of Miralax each evening, mixed with 8 ounces of water for constipation. I have sent this to the pharmacy. Make sure to ask them if it is cheaper to get this over the counter OR with a prescription.   We have scheduled you for a colonoscopy with Dr. Oneida Alar in the near future.   Further recommendations to follow!

## 2014-03-23 NOTE — Progress Notes (Signed)
Referring Provider: Octavio Graves, DO Primary Care Physician:  Octavio Graves, DO Primary GI: Dr. Oneida Alar   Chief Complaint  Patient presents with  . Follow-up    HPI:   Belinda [name] is a very vibrant 80 year old female with a history of chronic constipation and recently intermittent hematochezia. Last colonoscopy in 2009 by Dr. Anthony Sar with internal and external hemorrhoids, otherwise normal.  Sometimes has to sit in a hot pan of water because rectum hurts. Can't tolerate Linzess. Amitiza caused N/V. No further rectal bleeding. Takes Lactulose as needed. Would like a different regimen. Interested in pursuing colonoscopy due to rectal bleeding and constipation.     Past Medical History  Diagnosis Date  . GERD (gastroesophageal reflux disease)   . Chronic back pain   . Anxiety     Past Surgical History  Procedure Laterality Date  . Hernia repair    . Lumbar disc surgery    . Abdominal hysterectomy    . Cesarean section    . Hemorrhoid surgery    . Colonoscopy  April 2009    Dr. Anthony Sar: internal and external hemorrhoids, otherwise normal    Current Outpatient Prescriptions  Medication Sig Dispense Refill  . Cholecalciferol (VITAMIN D-3) 5000 UNITS TABS Take 5,000 Units by mouth daily.      Marland Kitchen esomeprazole (NEXIUM) 40 MG capsule Take 40 mg by mouth daily before breakfast.      . lactulose (CEPHULAC) 20 G packet Take 20 g by mouth as needed.       Marland Kitchen LORazepam (ATIVAN) 0.5 MG tablet Take 0.5 mg by mouth every 8 (eight) hours.      Marland Kitchen oxyCODONE-acetaminophen (PERCOCET) 10-325 MG per tablet Take 1 tablet by mouth every 4 (four) hours as needed for pain.      Marland Kitchen zolpidem (AMBIEN) 10 MG tablet Take 10 mg by mouth at bedtime as needed for sleep.       No current facility-administered medications for this visit.    Allergies as of 03/23/2014  . (No Known Allergies)    Family History  Problem Relation Age of Onset  . Colon cancer Neg Hx   . Stomach cancer Mother      History   Social History  . Marital Status: Widowed    Spouse Name: N/A    Number of Children: N/A  . Years of Education: N/A   Social History Main Topics  . Smoking status: Never Smoker   . Smokeless tobacco: Current User  . Alcohol Use: No  . Drug Use: No  . Sexual Activity: None   Other Topics Concern  . None   Social History Narrative  . None    Review of Systems: Negative unless mentioned in HPI.  Physical Exam: BP 128/73  Pulse 82  Temp(Src) 97 F (36.1 C) (Oral)  Resp 18  Ht 5\' 8"  (1.727 m)  Wt 175 lb (79.379 kg)  BMI 26.61 kg/m2 General:   Alert and oriented. No distress noted. Pleasant and cooperative.  Head:  Normocephalic and atraumatic. Eyes:  Conjuctiva clear without scleral icterus. Mouth:  Oral mucosa pink and moist. Good dentition. No lesions. Heart:  S1, S2 present without murmurs Abdomen:  +BS, soft, non-tender and non-distended. No rebound or guarding. LLQ ventral hernia Rectal: small external hemorrhoids noted. Internal exam without obvious mass. ?coccyx prominent in posterior rectum  Extremities:  Without edema. Neurologic:  Alert and  oriented x4;  grossly normal neurologically. Skin:  Intact without significant lesions or rashes. Psych:  Alert and cooperative. Normal mood and affect.

## 2014-03-24 NOTE — Assessment & Plan Note (Signed)
Intermittent in the setting of chronic constipation. Rectal exam without obvious mass. Query prominent coccyx posterior rectum. Last colonoscopy in 2009. Patient desires further evaluation due to rectal bleeding and persistent constipation. Although she is 45, she is quite vibrant and healthy. Will proceed with lower GI evaluation.   Proceed with colonoscopy with Dr. Oneida Alar in the near future. The risks, benefits, and alternatives have been discussed in detail with the patient. They state understanding and desire to proceed.

## 2014-03-24 NOTE — Assessment & Plan Note (Signed)
Unable to tolerate Linzess or Amitiza. Lactulose not as effective as desired. Will now trial Miralax each evening.

## 2014-03-25 NOTE — Progress Notes (Signed)
Cc to PCP 

## 2014-04-01 ENCOUNTER — Encounter (HOSPITAL_COMMUNITY): Payer: Self-pay | Admitting: Pharmacy Technician

## 2014-04-02 ENCOUNTER — Encounter (HOSPITAL_COMMUNITY): Payer: Self-pay | Admitting: Pharmacy Technician

## 2014-04-16 ENCOUNTER — Ambulatory Visit (HOSPITAL_COMMUNITY)
Admission: RE | Admit: 2014-04-16 | Discharge: 2014-04-16 | Disposition: A | Discharge status: Home / Self Care | Payer: Medicare Other | Source: Ambulatory Visit | Attending: Gastroenterology | Admitting: Gastroenterology

## 2014-04-16 ENCOUNTER — Encounter (HOSPITAL_COMMUNITY): Payer: Self-pay | Admitting: *Deleted

## 2014-04-16 ENCOUNTER — Other Ambulatory Visit: Payer: Self-pay | Admitting: Gastroenterology

## 2014-04-16 ENCOUNTER — Encounter (HOSPITAL_COMMUNITY)
Admission: RE | Disposition: A | Discharge status: Home / Self Care | Payer: Self-pay | Source: Ambulatory Visit | Attending: Gastroenterology

## 2014-04-16 ENCOUNTER — Telehealth: Payer: Self-pay | Admitting: Gastroenterology

## 2014-04-16 DIAGNOSIS — M549 Dorsalgia, unspecified: Secondary | ICD-10-CM | POA: Diagnosis not present

## 2014-04-16 DIAGNOSIS — K921 Melena: Secondary | ICD-10-CM

## 2014-04-16 DIAGNOSIS — Y929 Unspecified place or not applicable: Secondary | ICD-10-CM | POA: Diagnosis not present

## 2014-04-16 DIAGNOSIS — K219 Gastro-esophageal reflux disease without esophagitis: Secondary | ICD-10-CM | POA: Diagnosis not present

## 2014-04-16 DIAGNOSIS — Z79899 Other long term (current) drug therapy: Secondary | ICD-10-CM | POA: Diagnosis not present

## 2014-04-16 DIAGNOSIS — T183XXA Foreign body in small intestine, initial encounter: Secondary | ICD-10-CM | POA: Insufficient documentation

## 2014-04-16 DIAGNOSIS — K624 Stenosis of anus and rectum: Secondary | ICD-10-CM

## 2014-04-16 DIAGNOSIS — K922 Gastrointestinal hemorrhage, unspecified: Secondary | ICD-10-CM | POA: Insufficient documentation

## 2014-04-16 DIAGNOSIS — F411 Generalized anxiety disorder: Secondary | ICD-10-CM | POA: Diagnosis not present

## 2014-04-16 DIAGNOSIS — K648 Other hemorrhoids: Secondary | ICD-10-CM | POA: Diagnosis not present

## 2014-04-16 DIAGNOSIS — K5900 Constipation, unspecified: Secondary | ICD-10-CM | POA: Diagnosis not present

## 2014-04-16 DIAGNOSIS — T184XXA Foreign body in colon, initial encounter: Secondary | ICD-10-CM

## 2014-04-16 DIAGNOSIS — D126 Benign neoplasm of colon, unspecified: Secondary | ICD-10-CM | POA: Diagnosis not present

## 2014-04-16 DIAGNOSIS — G8929 Other chronic pain: Secondary | ICD-10-CM | POA: Insufficient documentation

## 2014-04-16 DIAGNOSIS — K5730 Diverticulosis of large intestine without perforation or abscess without bleeding: Secondary | ICD-10-CM | POA: Diagnosis not present

## 2014-04-16 DIAGNOSIS — IMO0002 Reserved for concepts with insufficient information to code with codable children: Secondary | ICD-10-CM | POA: Insufficient documentation

## 2014-04-16 HISTORY — PX: COLONOSCOPY: SHX5424

## 2014-04-16 HISTORY — DX: Constipation, unspecified: K59.00

## 2014-04-16 SURGERY — COLONOSCOPY
Anesthesia: Moderate Sedation

## 2014-04-16 MED ORDER — MEPERIDINE HCL 100 MG/ML IJ SOLN
INTRAMUSCULAR | Status: DC
Start: 2014-04-16 — End: 2014-04-16
  Filled 2014-04-16: qty 2

## 2014-04-16 MED ORDER — MEPERIDINE HCL 100 MG/ML IJ SOLN
INTRAMUSCULAR | Status: DC | PRN
  Administered 2014-04-16 (×3): 25 mg via INTRAVENOUS

## 2014-04-16 MED ORDER — SODIUM CHLORIDE 0.9 % IV SOLN
INTRAVENOUS | Status: DC
  Administered 2014-04-16: 08:00:00 via INTRAVENOUS

## 2014-04-16 MED ORDER — MIDAZOLAM HCL 5 MG/5ML IJ SOLN
INTRAMUSCULAR | Status: AC
  Filled 2014-04-16: qty 10

## 2014-04-16 MED ORDER — POLYETHYLENE GLYCOL 3350 17 GM/SCOOP PO POWD: ORAL | Status: DC

## 2014-04-16 MED ORDER — MIDAZOLAM HCL 5 MG/5ML IJ SOLN
INTRAMUSCULAR | Status: DC | PRN
  Administered 2014-04-16: 2 mg via INTRAVENOUS
  Administered 2014-04-16 (×2): 1 mg via INTRAVENOUS
  Administered 2014-04-16 (×2): 2 mg via INTRAVENOUS

## 2014-04-16 MED ORDER — STERILE WATER FOR IRRIGATION IR SOLN
Status: DC | PRN
  Administered 2014-04-16: 09:00:00

## 2014-04-16 NOTE — Telephone Encounter (Signed)
Referral has been made to Dr. Jenkins 

## 2014-04-16 NOTE — H&P (Signed)
  Primary Care Physician:  Octavio Graves, DO Primary Gastroenterologist:  Dr. Oneida Alar  Pre-Procedure History & Physical: HPI:  Belinda [name] is a 80 y.o. female here for  BRBPR.  Past Medical History  Diagnosis Date  . GERD (gastroesophageal reflux disease)   . Chronic back pain   . Anxiety   . Constipation     Past Surgical History  Procedure Laterality Date  . Hernia repair    . Lumbar disc surgery    . Abdominal hysterectomy    . Cesarean section    . Hemorrhoid surgery    . Colonoscopy  April 2009    Dr. Anthony Sar: internal and external hemorrhoids, otherwise normal    Prior to Admission medications   Medication Sig Start Date End Date Taking? Authorizing Provider  Cholecalciferol (VITAMIN D-3) 5000 UNITS TABS Take 5,000 Units by mouth daily.   Yes Historical Provider, MD  esomeprazole (NEXIUM) 40 MG capsule Take 40 mg by mouth daily before breakfast.   Yes Historical Provider, MD  gabapentin (NEURONTIN) 100 MG capsule Take 100 mg by mouth 2 (two) times daily.   Yes Historical Provider, MD  LORazepam (ATIVAN) 0.5 MG tablet Take 0.5 mg by mouth every 8 (eight) hours.   Yes Historical Provider, MD  oxyCODONE-acetaminophen (PERCOCET) 10-325 MG per tablet Take 1 tablet by mouth every 4 (four) hours as needed for pain.   Yes Historical Provider, MD  polyethylene glycol powder (GLYCOLAX/MIRALAX) powder Take 1 capful daily each evening for constipation. 03/23/14  Yes Orvil Feil, NP  polyethylene glycol-electrolytes (TRILYTE) 420 G solution Take 4,000 mLs by mouth as directed. 03/23/14  Yes Danie Binder, MD  zolpidem (AMBIEN) 10 MG tablet Take 10 mg by mouth at bedtime.    Yes Historical Provider, MD    Allergies as of 03/23/2014  . (No Known Allergies)    Family History  Problem Relation Age of Onset  . Colon cancer Neg Hx   . Stomach cancer Mother     History   Social History  . Marital Status: Widowed    Spouse Name: N/A    Number of Children: N/A  . Years of  Education: N/A   Occupational History  . Not on file.   Social History Main Topics  . Smoking status: Never Smoker   . Smokeless tobacco: Current User  . Alcohol Use: No  . Drug Use: No  . Sexual Activity: Not on file   Other Topics Concern  . Not on file   Social History Narrative  . No narrative on file    Review of Systems: See HPI, otherwise negative ROS   Physical Exam: BP 158/77  Pulse 78  Temp(Src) 97.6 F (36.4 C) (Oral)  Resp 20  Ht 5\' 8"  (1.727 m)  Wt 175 lb (79.379 kg)  BMI 26.61 kg/m2  SpO2 96% General:   Alert,  pleasant and cooperative in NAD Head:  Normocephalic and atraumatic. Neck:  Supple; Lungs:  Clear throughout to auscultation.    Heart:  Regular rate and rhythm. Abdomen:  Soft, nontender and nondistended. Normal bowel sounds, without guarding, and without rebound.   Neurologic:  Alert and  oriented x4;  grossly normal neurologically.  Impression/Plan:    BRBPR  PLAN: TCS TODAY

## 2014-04-16 NOTE — Discharge Instructions (Signed)
You had 16 polyps removed. You have diverticulosis IN YOUR LEFT COLON.   FOLLOW A HIGH FIBER DIET. AVOID ITEMS THAT CAUSE BLOATING. SEE INFO BELOW.  YOUR BIOPSY RESULTS SHOULD BE BACK IN 7 DAYS.  Next colonoscopy in 1-3 years with an overtube.   Colonoscopy Care After Read the instructions outlined below and refer to this sheet in the next week. These discharge instructions provide you with general information on caring for yourself after you leave the hospital. While your treatment has been planned according to the most current medical practices available, unavoidable complications occasionally occur. If you have any problems or questions after discharge, call DR. Aury Scollard, [phone].  ACTIVITY  You may resume your regular activity, but move at a slower pace for the next 24 hours.   Take frequent rest periods for the next 24 hours.   Walking will help get rid of the air and reduce the bloated feeling in your belly (abdomen).   No driving for 24 hours (because of the medicine (anesthesia) used during the test).   You may shower.   Do not sign any important legal documents or operate any machinery for 24 hours (because of the anesthesia used during the test).    NUTRITION  Drink plenty of fluids.   You may resume your normal diet as instructed by your doctor.   Begin with a light meal and progress to your normal diet. Heavy or fried foods are harder to digest and may make you feel sick to your stomach (nauseated).   Avoid alcoholic beverages for 24 hours or as instructed.    MEDICATIONS  You may resume your normal medications.   WHAT YOU CAN EXPECT TODAY  Some feelings of bloating in the abdomen.   Passage of more gas than usual.   Spotting of blood in your stool or on the toilet paper  .  IF YOU HAD POLYPS REMOVED DURING THE COLONOSCOPY:  Eat a soft diet IF YOU HAVE NAUSEA, BLOATING, ABDOMINAL PAIN, OR VOMITING.    FINDING OUT THE RESULTS OF YOUR TEST Not  all test results are available during your visit. DR. Oneida Alar WILL CALL YOU WITHIN 7 DAYS OF YOUR PROCEDUE WITH YOUR RESULTS. Do not assume everything is normal if you have not heard from DR. Uday Jantz IN ONE WEEK, CALL HER OFFICE AT [phone].  SEEK IMMEDIATE MEDICAL ATTENTION AND CALL THE OFFICE: [phone] IF:  You have more than a spotting of blood in your stool.   Your belly is swollen (abdominal distention).   You are nauseated or vomiting.   You have a temperature over 101F.   You have abdominal pain or discomfort that is severe or gets worse throughout the day.  Polyps, Colon  A polyp is extra tissue that grows inside your body. Colon polyps grow in the large intestine. The large intestine, also called the colon, is part of your digestive system. It is a long, hollow tube at the end of your digestive tract where your body makes and stores stool. Most polyps are not dangerous. They are benign. This means they are not cancerous. But over time, some types of polyps can turn into cancer. Polyps that are smaller than a pea are usually not harmful. But larger polyps could someday become or may already be cancerous. To be safe, doctors remove all polyps and test them.   WHO GETS POLYPS? Anyone can get polyps, but certain people are more likely than others. You may have a greater chance of getting polyps  if:  You are over 50.   You have had polyps before.   Someone in your family has had polyps.   Someone in your family has had cancer of the large intestine.   Find out if someone in your family has had polyps. You may also be more likely to get polyps if you:   Eat a lot of fatty foods   Smoke   Drink alcohol   Do not exercise  Eat too much   TREATMENT  The caregiver will remove the polyp during sigmoidoscopy or colonoscopy.  PREVENTION There is not one sure way to prevent polyps. You might be able to lower your risk of getting them if you:  Eat more fruits and vegetables  and less fatty food.   Do not smoke.   Avoid alcohol.   Exercise every day.   Lose weight if you are overweight.   Eating more calcium and folate can also lower your risk of getting polyps. Some foods that are rich in calcium are milk, cheese, and broccoli. Some foods that are rich in folate are chickpeas, kidney beans, and spinach.   High-Fiber Diet A high-fiber diet changes your normal diet to include more whole grains, legumes, fruits, and vegetables. Changes in the diet involve replacing refined carbohydrates with unrefined foods. The calorie level of the diet is essentially unchanged. The Dietary Reference Intake (recommended amount) for adult males is 38 grams per day. For adult females, it is 25 grams per day. Pregnant and lactating women should consume 28 grams of fiber per day. Fiber is the intact part of a plant that is not broken down during digestion. Functional fiber is fiber that has been isolated from the plant to provide a beneficial effect in the body. PURPOSE  Increase stool bulk.   Ease and regulate bowel movements.   Lower cholesterol.  INDICATIONS THAT YOU NEED MORE FIBER  Constipation and hemorrhoids.   Uncomplicated diverticulosis (intestine condition) and irritable bowel syndrome.   Weight management.   As a protective measure against hardening of the arteries (atherosclerosis), diabetes, and cancer.   GUIDELINES FOR INCREASING FIBER IN THE DIET  Start adding fiber to the diet slowly. A gradual increase of about 5 more grams (2 slices of whole-wheat bread, 2 servings of most fruits or vegetables, or 1 bowl of high-fiber cereal) per day is best. Too rapid an increase in fiber may result in constipation, flatulence, and bloating.   Drink enough water and fluids to keep your urine clear or pale yellow. Water, juice, or caffeine-free drinks are recommended. Not drinking enough fluid may cause constipation.   Eat a variety of high-fiber foods rather than one  type of fiber.   Try to increase your intake of fiber through using high-fiber foods rather than fiber pills or supplements that contain small amounts of fiber.   The goal is to change the types of food eaten. Do not supplement your present diet with high-fiber foods, but replace foods in your present diet.  INCLUDE A VARIETY OF FIBER SOURCES  Replace refined and processed grains with whole grains, canned fruits with fresh fruits, and incorporate other fiber sources. White rice, white breads, and most bakery goods contain little or no fiber.   Brown whole-grain rice, buckwheat oats, and many fruits and vegetables are all good sources of fiber. These include: broccoli, Brussels sprouts, cabbage, cauliflower, beets, sweet potatoes, white potatoes (skin on), carrots, tomatoes, eggplant, squash, berries, fresh fruits, and dried fruits.   Cereals appear  to be the richest source of fiber. Cereal fiber is found in whole grains and bran. Bran is the fiber-rich outer coat of cereal grain, which is largely removed in refining. In whole-grain cereals, the bran remains. In breakfast cereals, the largest amount of fiber is found in those with "bran" in their names. The fiber content is sometimes indicated on the label.   You may need to include additional fruits and vegetables each day.   In baking, for 1 cup white flour, you may use the following substitutions:   1 cup whole-wheat flour minus 2 tablespoons.   1/2 cup white flour plus 1/2 cup whole-wheat flour.   Diverticulosis Diverticulosis is a common condition that develops when small pouches (diverticula) form in the wall of the colon. The risk of diverticulosis increases with age. It happens more often in people who eat a low-fiber diet. Most individuals with diverticulosis have no symptoms. Those individuals with symptoms usually experience belly (abdominal) pain, constipation, or loose stools (diarrhea).  HOME CARE INSTRUCTIONS  Increase the  amount of fiber in your diet as directed by your caregiver or dietician. This may reduce symptoms of diverticulosis.   Drink at least 6 to 8 glasses of water each day to prevent constipation.   Try not to strain when you have a bowel movement.   Avoiding nuts and seeds to prevent complications is still an uncertain benefit.       FOODS HAVING HIGH FIBER CONTENT INCLUDE:  Fruits. Apple, peach, pear, tangerine, raisins, prunes.   Vegetables. Brussels sprouts, asparagus, broccoli, cabbage, carrot, cauliflower, romaine lettuce, spinach, summer squash, tomato, winter squash, zucchini.   Starchy Vegetables. Baked beans, kidney beans, lima beans, split peas, lentils, potatoes (with skin).   Grains. Whole wheat bread, brown rice, bran flake cereal, plain oatmeal, white rice, shredded wheat, bran muffins.    SEEK IMMEDIATE MEDICAL CARE IF:  You develop increasing pain or severe bloating.   You have an oral temperature above 101F.   You develop vomiting or bowel movements that are bloody or black.

## 2014-04-16 NOTE — Telephone Encounter (Signed)
PT NEEDS TO SEE DR. Arnoldo Morale FOR ANAL STENOSIS/DILATION/STOOL BEZOAR ASAP.

## 2014-04-19 ENCOUNTER — Ambulatory Visit (HOSPITAL_COMMUNITY)
Admission: RE | Admit: 2014-04-19 | Discharge: 2014-04-19 | Disposition: A | Discharge status: Home / Self Care | Payer: Medicare Other | Source: Ambulatory Visit | Attending: Ophthalmology | Admitting: Ophthalmology

## 2014-04-19 ENCOUNTER — Encounter (HOSPITAL_COMMUNITY): Payer: Self-pay | Admitting: *Deleted

## 2014-04-19 ENCOUNTER — Encounter (HOSPITAL_COMMUNITY)
Admission: RE | Disposition: A | Discharge status: Home / Self Care | Payer: Self-pay | Source: Ambulatory Visit | Attending: Ophthalmology

## 2014-04-19 DIAGNOSIS — K219 Gastro-esophageal reflux disease without esophagitis: Secondary | ICD-10-CM | POA: Insufficient documentation

## 2014-04-19 DIAGNOSIS — F411 Generalized anxiety disorder: Secondary | ICD-10-CM | POA: Insufficient documentation

## 2014-04-19 DIAGNOSIS — H409 Unspecified glaucoma: Secondary | ICD-10-CM | POA: Insufficient documentation

## 2014-04-19 DIAGNOSIS — M545 Low back pain, unspecified: Secondary | ICD-10-CM | POA: Diagnosis not present

## 2014-04-19 DIAGNOSIS — K625 Hemorrhage of anus and rectum: Secondary | ICD-10-CM | POA: Diagnosis present

## 2014-04-19 DIAGNOSIS — K5900 Constipation, unspecified: Secondary | ICD-10-CM | POA: Insufficient documentation

## 2014-04-19 DIAGNOSIS — Z90710 Acquired absence of both cervix and uterus: Secondary | ICD-10-CM | POA: Insufficient documentation

## 2014-04-19 HISTORY — PX: SLT LASER APPLICATION: SHX6099

## 2014-04-19 SURGERY — SLT LASER APPLICATION
Anesthesia: LOCAL | Laterality: Right

## 2014-04-19 MED ORDER — HYPROMELLOSE (GONIOSCOPIC) 2.5 % OP SOLN
OPHTHALMIC | Status: AC
  Filled 2014-04-19: qty 15

## 2014-04-19 MED ORDER — PILOCARPINE HCL 1 % OP SOLN
OPHTHALMIC | Status: AC
Start: 2014-04-19 — End: 2014-04-19
  Filled 2014-04-19: qty 15

## 2014-04-19 MED ORDER — APRACLONIDINE HCL 1 % OP SOLN
OPHTHALMIC | Status: AC
Start: 2014-04-19 — End: 2014-04-19
  Filled 2014-04-19: qty 0.1

## 2014-04-19 MED ORDER — TETRACAINE HCL 0.5 % OP SOLN
1.0000 [drp] | OPHTHALMIC | Status: DC | PRN
  Administered 2014-04-19: 1 [drp] via OPHTHALMIC

## 2014-04-19 MED ORDER — TETRACAINE HCL 0.5 % OP SOLN
OPHTHALMIC | Status: AC
  Filled 2014-04-19: qty 2

## 2014-04-19 MED ORDER — PILOCARPINE HCL 1 % OP SOLN
1.0000 [drp] | Freq: Once | OPHTHALMIC | Status: AC
  Administered 2014-04-19: 1 [drp] via OPHTHALMIC

## 2014-04-19 MED ORDER — APRACLONIDINE HCL 1 % OP SOLN
1.0000 [drp] | OPHTHALMIC | Status: AC
  Administered 2014-04-19 (×3): 1 [drp] via OPHTHALMIC

## 2014-04-19 MED ORDER — HYPROMELLOSE (GONIOSCOPIC) 2.5 % OP SOLN
1.0000 [drp] | Freq: Once | OPHTHALMIC | Status: AC
  Administered 2014-04-19: 1 [drp] via OPHTHALMIC

## 2014-04-19 NOTE — H&P (Signed)
I have reviewed the pre printed H&P, the patient was re-examined, and I have identified no significant interval changes in the patient's medical condition.  There is no change in the plan of care since the history and physical of record. 

## 2014-04-19 NOTE — Op Note (Signed)
[facility] [address] [place], [address]   COLONOSCOPY PROCEDURE REPORT  PATIENT: Belinda [name], Belinda [name]  MR#: [id number] BIRTHDATE: 02/10/1927 , 87  yrs. old GENDER: Female ENDOSCOPIST: Barney Drain, MD REFERRED BY/TO:Cynthia Satira Anis, M.D. PROCEDURE DATE:  04/16/2014 PROCEDURE:   Colonoscopy with cold biopsy polypectomy and Colonoscopy with snare polypectomy INDICATIONS:Rectal Bleeding. MEDICATIONS: Demerol 75 mg IV and Versed 8 mg IV  DESCRIPTION OF PROCEDURE:    Physical exam was performed.  Informed consent was obtained from the patient after explaining the benefits, risks, and alternatives to procedure.  The patient was connected to monitor and placed in left lateral position. Continuous oxygen was provided by nasal cannula and IV medicine administered through an indwelling cannula.  After administration of sedation and rectal exam, the patients rectum was intubated and the EC-3890Li ([id number]) and EG-[id number] ([id number])  colonoscope was advanced under direct visualization to the cecum.  The scope was removed slowly by carefully examining the color, texture, anatomy, and integrity mucosa on the way out.  The patient was recovered in endoscopy and discharged home in satisfactory condition.    COLON FINDINGS: A sessile polyp measuring 4 mm in size was found at the cecum.  A polypectomy was performed with cold forceps. Multiple polyps(15) measuring 6-8 mm in size were found at the cecum(6), in the ascending colon(3), transverse colon(5), and descending colon(2).  A polypectomy was performed using snare cautery.  , There was mild diverticulosis noted in the descending colon and sigmoid colon with associated muscular hypertrophy.  , Small internal hemorrhoids were found.  , and BEZOAR IN RECTUM. ATTEMPTED TO REDUCE IN SIZE VIA ENDOSCOPY AND RECTAL EXAM.  ANAL STENOSIS PRESENT.  PREP QUALITY: good.CECAL W/D TIME: 49 minutes      COMPLICATIONS: None  ENDOSCOPIC IMPRESSION: 1.   16 COLON POLYPS REMOVED 3.   Mild diverticulosis in the descending colon and sigmoid colon 4.   Small internal hemorrhoids 5.  BEZOAR IN RECTUM DUE TO ANAL STENOSIS.  RECOMMENDATIONS: FOLLOW A HIGH FIBER DIET.  AVOID ITEMS THAT CAUSE BLOATING.  SEE INFO BELOW. BIOPSY RESULTS SHOULD BE BACK IN 7 DAYS. Next colonoscopy in 1-3 years with an overtube. REFER TO DR. Arnoldo Morale FOR ANAL STENOSIS  ______________________________eSigned:  Barney Drain, MD 04/19/2014 3:21 PM

## 2014-04-19 NOTE — Op Note (Signed)
None required

## 2014-04-19 NOTE — Discharge Instructions (Signed)
Belinda [name]  04/19/2014     Instructions    Activity: No Restrictions.   Diet: Resume Diet you were on at home.   Pain Medication: Tylenol if Needed.   CONTACT YOUR DOCTOR IF YOU HAVE PAIN, REDNESS IN YOUR EYE, OR DECREASED VISION.   Follow-up:Sept 22th @ 11:15 am with Williams Che, MD.    Dr. Iona Hansen: [phone]     If you find that you cannot contact your physician, but feel that your signs and   Symptoms warrant a physician's attention, call the Emergency Room at   [phone] [phone].

## 2014-04-19 NOTE — Brief Op Note (Signed)
Belinda [name] 04/19/2014  Williams Che, MD  Pre-op Diagnosis:  uncontrolled glaucoma right eye  Post-op Diagnosis: * No post-op diagnosis entered *  Yag laser self-test completed: Yes.    Indications:  Uncontrolled glaucoma OD  Procedure: SLT right  eye  Eye protection worn by staff:  Yes.   Laser In Use sign on door:  Yes.    Laser:  Lumenis SLT  Power Setting:  0.9 mJ/burst Anatomical site treated:  Trabecular meshwork 360 deg Number of applications:  87 Total energy delivered: 78.3 mJ Results:  Gonioscopy OD:  Open 360 grade 4, no pigment, no synechiae, no recessions  The patient was discharged home with instructions to continue all her current glaucoma medications in the un-operated eye, and discontinue all her current glaucoma medications, if any.  Patient was instructed to go to the office, as previously scheduled, for intraocular pressure:  Yes.    Patient verbalizes understanding of discharge instructions:  Yes.

## 2014-04-29 NOTE — Telephone Encounter (Signed)
Called and informed pt. She said she really did like Dr. Oneida Alar and all of the staff. Everyone was really nice and she also wanted to let Dr. Oneida Alar know that she really did like Dr. Arnoldo Morale also.

## 2014-04-29 NOTE — Progress Notes (Signed)
REVIEWED-NO ADDITIONAL RECOMMENDATIONS. 

## 2014-04-29 NOTE — Telephone Encounter (Addendum)
Please call pt. She had simple adenomas removed.  FOLLOW A HIGH FIBER DIET. AVOID ITEMS THAT CAUSE BLOATING.  Next colonoscopy in 3 years with an overtube IF THE BENEFITS OUTWEIGH THE RISKS. ALL SISTERS, BROTHERS, AND CHILDREN NEED TO HAVE A COLONOSCOPY AFTER THE AGE OF 40.

## 2014-04-29 NOTE — Telephone Encounter (Signed)
REVIEWED. OPV IN 4 MOS E30 RECTAL BLEEDING/CONSTIPATION

## 2014-04-29 NOTE — Telephone Encounter (Signed)
NIC'D FOR OV AND 5 YR COLONOSCOPY

## 2014-08-16 ENCOUNTER — Encounter: Payer: Self-pay | Admitting: Gastroenterology

## 2014-09-06 ENCOUNTER — Encounter: Payer: Self-pay | Admitting: Gastroenterology

## 2014-10-05 ENCOUNTER — Ambulatory Visit: Payer: Medicare Other | Admitting: Gastroenterology

## 2014-10-05 ENCOUNTER — Telehealth: Payer: Self-pay | Admitting: General Practice

## 2014-10-05 ENCOUNTER — Encounter: Payer: Self-pay | Admitting: General Practice

## 2014-10-05 NOTE — Telephone Encounter (Signed)
I received a voicemail from a family member that the patient would like to reschedule her appointment.  I was unable to reach the patient by phone, so I rescheduled her appointment to 10/20/2014 at 2pm and mailed her a letter.

## 2014-10-20 ENCOUNTER — Ambulatory Visit: Payer: Medicare Other | Admitting: Nurse Practitioner

## 2015-10-01 NOTE — Progress Notes (Signed)
REVIEWED-NO ADDITIONAL RECOMMENDATIONS. 

## 2016-07-19 ENCOUNTER — Emergency Department (HOSPITAL_COMMUNITY): Payer: Medicare Other

## 2016-07-19 ENCOUNTER — Encounter (HOSPITAL_COMMUNITY): Payer: Self-pay | Admitting: Emergency Medicine

## 2016-07-19 ENCOUNTER — Emergency Department (HOSPITAL_COMMUNITY)
Admission: EM | Admit: 2016-07-19 | Discharge: 2016-07-19 | Disposition: A | Discharge status: Home / Self Care | Payer: Medicare Other | Attending: Emergency Medicine | Admitting: Emergency Medicine

## 2016-07-19 DIAGNOSIS — K5641 Fecal impaction: Secondary | ICD-10-CM

## 2016-07-19 DIAGNOSIS — K5900 Constipation, unspecified: Secondary | ICD-10-CM | POA: Insufficient documentation

## 2016-07-19 DIAGNOSIS — Z79899 Other long term (current) drug therapy: Secondary | ICD-10-CM | POA: Insufficient documentation

## 2016-07-19 DIAGNOSIS — K6289 Other specified diseases of anus and rectum: Secondary | ICD-10-CM

## 2016-07-19 DIAGNOSIS — F17200 Nicotine dependence, unspecified, uncomplicated: Secondary | ICD-10-CM | POA: Insufficient documentation

## 2016-07-19 LAB — CBC
HCT: 35.3 % — ABNORMAL LOW (ref 36.0–46.0)
HEMOGLOBIN: 11.1 g/dL — AB (ref 12.0–15.0)
MCH: 28.9 pg (ref 26.0–34.0)
MCHC: 31.4 g/dL (ref 30.0–36.0)
MCV: 91.9 fL (ref 78.0–100.0)
Platelets: 279 10*3/uL (ref 150–400)
RBC: 3.84 MIL/uL — AB (ref 3.87–5.11)
RDW: 13.5 % (ref 11.5–15.5)
WBC: 6 10*3/uL (ref 4.0–10.5)

## 2016-07-19 LAB — URINALYSIS, ROUTINE W REFLEX MICROSCOPIC
Bilirubin Urine: NEGATIVE
Glucose, UA: NEGATIVE mg/dL
Hgb urine dipstick: NEGATIVE
LEUKOCYTES UA: NEGATIVE
NITRITE: NEGATIVE
PH: 5 (ref 5.0–8.0)
Protein, ur: NEGATIVE mg/dL

## 2016-07-19 LAB — COMPREHENSIVE METABOLIC PANEL
ALT: 11 U/L — ABNORMAL LOW (ref 14–54)
ANION GAP: 7 (ref 5–15)
AST: 22 U/L (ref 15–41)
Albumin: 3.9 g/dL (ref 3.5–5.0)
Alkaline Phosphatase: 53 U/L (ref 38–126)
BILIRUBIN TOTAL: 0.3 mg/dL (ref 0.3–1.2)
BUN: 12 mg/dL (ref 6–20)
CHLORIDE: 101 mmol/L (ref 101–111)
CO2: 29 mmol/L (ref 22–32)
Calcium: 8.7 mg/dL — ABNORMAL LOW (ref 8.9–10.3)
Creatinine, Ser: 0.88 mg/dL (ref 0.44–1.00)
GFR calc Af Amer: >60 mL/min (ref >60)
GFR calc non Af Amer: 57 mL/min — ABNORMAL LOW (ref >60)
Glucose, Bld: 111 mg/dL — ABNORMAL HIGH (ref 65–99)
Potassium: 3.8 mmol/L (ref 3.5–5.1)
Sodium: 137 mmol/L (ref 135–145)
TOTAL PROTEIN: 7.1 g/dL (ref 6.5–8.1)

## 2016-07-19 LAB — POC OCCULT BLOOD, ED: Fecal Occult Bld: NEGATIVE

## 2016-07-19 LAB — LIPASE, BLOOD: Lipase: 25 U/L (ref 11–51)

## 2016-07-19 NOTE — ED Provider Notes (Signed)
[facility] DEPT Provider Note   CSN: [id number] Arrival date & time: 07/19/16  1445     History   Chief Complaint Chief Complaint  Patient presents with  . Rectal Pain  . Abdominal Pain    HPI Belinda [name] is a 82 y.o. female.  HPI  Pt was seen at [place]. Per pt, c/o gradual onset and persistence of constant rectal "fullness" for the past 1 week. Has been associated with LLQ pain. Denies N/V/D, no back pain, no dysuria, no black or blood in stools.    Past Medical History:  Diagnosis Date  . Anxiety   . Chronic back pain   . Constipation   . GERD (gastroesophageal reflux disease)     Patient Active Problem List   Diagnosis Date Noted  . Hematochezia 03/23/2014  . Unspecified constipation 01/13/2014  . Abdominal wall bulge secondary to atrophic muscle 12/30/2012    Past Surgical History:  Procedure Laterality Date  . ABDOMINAL HYSTERECTOMY    . CESAREAN SECTION    . COLONOSCOPY  April 2009   Dr. Anthony Sar: internal and external hemorrhoids, otherwise normal  . COLONOSCOPY N/A 04/16/2014   SLF:16 polyps removed/mild diverticulosis/small interal hemorrhoids/bezoar in rectum due to anal stenosis  . EYE SURGERY    . HEMORRHOID SURGERY    . HERNIA REPAIR    . LUMBAR DISC SURGERY    . SLT LASER APPLICATION Right [id number]   Procedure: SLT LASER APPLICATION;  Surgeon: Williams Che, MD;  Location: [facility];  Service: Ophthalmology;  Laterality: Right;    OB History    No data available       Home Medications    Prior to Admission medications   Medication Sig Start Date End Date Taking? Authorizing Provider  Cholecalciferol (VITAMIN D-3) 5000 UNITS TABS Take 5,000 Units by mouth daily.    Historical Provider, MD  esomeprazole (NEXIUM) 40 MG capsule Take 40 mg by mouth daily before breakfast.    Historical Provider, MD  gabapentin (NEURONTIN) 100 MG capsule Take 100 mg by mouth 2 (two) times daily.    Historical Provider, MD  LORazepam (ATIVAN) 0.5 MG  tablet Take 0.5 mg by mouth every 8 (eight) hours.    Historical Provider, MD  oxyCODONE-acetaminophen (PERCOCET) 10-325 MG per tablet Take 1 tablet by mouth every 4 (four) hours as needed for pain.    Historical Provider, MD  polyethylene glycol powder (GLYCOLAX/MIRALAX) powder Take 1 capful bid evening for constipation. 04/16/14   Danie Binder, MD  zolpidem (AMBIEN) 10 MG tablet Take 10 mg by mouth at bedtime.     Historical Provider, MD    Family History Family History  Problem Relation Age of Onset  . Stomach cancer Mother   . Colon cancer Neg Hx     Social History Social History  Substance Use Topics  . Smoking status: Never Smoker  . Smokeless tobacco: Current User  . Alcohol use No     Allergies   Patient has no known allergies.   Review of Systems Review of Systems ROS: Statement: All systems negative except as marked or noted in the HPI; Constitutional: Negative for fever and chills. ; ; Eyes: Negative for eye pain, redness and discharge. ; ; ENMT: Negative for ear pain, hoarseness, nasal congestion, sinus pressure and sore throat. ; ; Cardiovascular: Negative for chest pain, palpitations, diaphoresis, dyspnea and peripheral edema. ; ; Respiratory: Negative for cough, wheezing and stridor. ; ; Gastrointestinal: +rectal fullness, abd pain. Negative for nausea,  vomiting, diarrhea, blood in stool, hematemesis, jaundice and rectal bleeding. . ; ; Genitourinary: Negative for dysuria, flank pain and hematuria. ; ; Musculoskeletal: Negative for back pain and neck pain. Negative for swelling and trauma.; ; Skin: Negative for pruritus, rash, abrasions, blisters, bruising and skin lesion.; ; Neuro: Negative for headache, lightheadedness and neck stiffness. Negative for weakness, altered level of consciousness, altered mental status, extremity weakness, paresthesias, involuntary movement, seizure and syncope.       Physical Exam Updated Vital Signs BP 164/66 (BP Location: Left Arm)    Pulse 76   Temp 97.8 F (36.6 C) (Oral)   Resp 18   Ht 5\' 8"  (1.727 m)   Wt 162 lb (73.5 kg)   SpO2 99%   BMI 24.63 kg/m   Physical Exam 1840: Physical examination:  Nursing notes reviewed; Vital signs and O2 SAT reviewed;  Constitutional: Well developed, Well nourished, Well hydrated, In no acute distress; Head:  Normocephalic, atraumatic; Eyes: EOMI, PERRL, No scleral icterus; ENMT: Mouth and pharynx normal, Mucous membranes moist; Neck: Supple, Full range of motion, No lymphadenopathy; Cardiovascular: Regular rate and rhythm, No gallop; Respiratory: Breath sounds clear & equal bilaterally, No wheezes.  Speaking full sentences with ease, Normal respiratory effort/excursion; Chest: Nontender, Movement normal; Abdomen: Soft, +NT reducible hernia LLQ without overlying erythema or ecchymosis. Nontender, Nondistended, Normal bowel sounds. Rectal exam performed w/permission of pt and ED RN chaperone present.  Anal tone normal.  Non-tender, soft brown stool in rectal vault, heme neg. +fecal impaction.  No fissures, +external hemorrhoids without thrombosis or bleeding.;;; Genitourinary: No CVA tenderness; Extremities: Pulses normal, No tenderness, No edema, No calf edema or asymmetry.; Neuro: AA&Ox3, Major CN grossly intact.  Speech clear. No gross focal motor or sensory deficits in extremities. Climbs on and off stretcher easily by herself. Gait steady.; Skin: Color normal, Warm, Dry.   ED Treatments / Results  Labs (all labs ordered are listed, but only abnormal results are displayed)   EKG  EKG Interpretation None       Radiology   Procedures Procedures (including critical care time)  Medications Ordered in ED Medications - No data to display   Initial Impression / Assessment and Plan / ED Course  I have reviewed the triage vital signs and the nursing notes.  Pertinent labs & imaging results that were available during my care of the patient were reviewed by me and considered in  my medical decision making (see chart for details).  MDM Reviewed: previous chart, nursing note and vitals Reviewed previous: labs Interpretation: labs and x-ray    Results for orders placed or performed during the hospital encounter of 07/19/16  Lipase, blood  Result Value Ref Range   Lipase 25 11 - 51 U/L  Comprehensive metabolic panel  Result Value Ref Range   Sodium 137 135 - 145 mmol/L   Potassium 3.8 3.5 - 5.1 mmol/L   Chloride 101 101 - 111 mmol/L   CO2 29 22 - 32 mmol/L   Glucose, Bld 111 (H) 65 - 99 mg/dL   BUN 12 6 - 20 mg/dL   Creatinine, Ser 0.88 0.44 - 1.00 mg/dL   Calcium 8.7 (L) 8.9 - 10.3 mg/dL   Total Protein 7.1 6.5 - 8.1 g/dL   Albumin 3.9 3.5 - 5.0 g/dL   AST 22 15 - 41 U/L   ALT 11 (L) 14 - 54 U/L   Alkaline Phosphatase 53 38 - 126 U/L   Total Bilirubin 0.3 0.3 - 1.2 mg/dL  GFR calc non Af Amer 57 (L) >60 mL/min   GFR calc Af Amer >60 >60 mL/min   Anion gap 7 5 - 15  CBC  Result Value Ref Range   WBC 6.0 4.0 - 10.5 K/uL   RBC 3.84 (L) 3.87 - 5.11 MIL/uL   Hemoglobin 11.1 (L) 12.0 - 15.0 g/dL   HCT 35.3 (L) 36.0 - 46.0 %   MCV 91.9 78.0 - 100.0 fL   MCH 28.9 26.0 - 34.0 pg   MCHC 31.4 30.0 - 36.0 g/dL   RDW 13.5 11.5 - 15.5 %   Platelets 279 150 - 400 K/uL  Urinalysis, Routine w reflex microscopic  Result Value Ref Range   Color, Urine YELLOW YELLOW   APPearance CLEAR CLEAR   Specific Gravity, Urine >1.030 (H) 1.005 - 1.030   pH 5.0 5.0 - 8.0   Glucose, UA NEGATIVE NEGATIVE mg/dL   Hgb urine dipstick NEGATIVE NEGATIVE   Bilirubin Urine NEGATIVE NEGATIVE   Ketones, ur TRACE (A) NEGATIVE mg/dL   Protein, ur NEGATIVE NEGATIVE mg/dL   Nitrite NEGATIVE NEGATIVE   Leukocytes, UA NEGATIVE NEGATIVE  POC occult blood, ED  Result Value Ref Range   Fecal Occult Bld NEGATIVE NEGATIVE   Dg Abd Acute W/chest Result Date: 07/19/2016 CLINICAL DATA:  Patient with left lower quadrant pain and constipation for 2- 3 weeks. EXAM: DG ABDOMEN ACUTE W/ 1V  CHEST COMPARISON:  Chest radiograph 12/15/2004 ; CT abdomen pelvis 12/05/2012. FINDINGS: Stable cardiac and mediastinal contours. No consolidative pulmonary opacities. No pleural effusion or pneumothorax. Gas is demonstrated within nondilated loops of large and small bowel in a nonobstructed pattern. There is a large amount of stool within the rectum, transverse and descending colon. No evidence for free intraperitoneal air. Lumbar spinal fusion hardware. IMPRESSION: Large amount of stool within the distal transverse, descending colon and rectum, compatible with constipation. No acute cardiopulmonary process. Electronically Signed   By: Lovey Newcomer M.D.   On: 07/19/2016 19:17   2010:  Attempted disimpaction, pt then ambulatory with steady gait to the bathroom. Pt declines further disimpaction. Abd remains benign. Pt and family would like to go home now. Dx and testing d/w pt and family.  Questions answered.  Verb understanding, agreeable to d/c home with outpt f/u.    Final Clinical Impressions(s) / ED Diagnoses   Final diagnoses:  None    New Prescriptions New Prescriptions   No medications on file     Francine Graven, DO 07/23/16 2014

## 2016-07-19 NOTE — ED Triage Notes (Addendum)
Pt reports rectal pain x1 week and has hernia in lower left quadrant that is causing her pain.  Denies blood in stool at this time, unsure if she has hemorrhoids.  Pt denies n/v/d. Last bm yesterday.

## 2016-07-19 NOTE — ED Notes (Signed)
Patient transported to X-ray 

## 2016-07-19 NOTE — ED Notes (Signed)
Returned from XR 

## 2016-07-19 NOTE — Discharge Instructions (Signed)
Take over the counter laxative (such as miralax, milk of magnesia, senokot) AND a dulcolax suppository (or enema) today and repeat both tomorrow.  Begin to take over the counter stool softener (colace or miralax), as directed on packaging, for the next month, or for as long as you are taking narcotic pain medications.  Continue to take your usual prescriptions as previously directed.  Call your regular medical doctor tomorrow to schedule a follow up appointment within the next 3 days.  Return to the Emergency Department immediately if worsening.

## 2017-04-01 ENCOUNTER — Encounter: Payer: Self-pay | Admitting: Gastroenterology

## 2017-05-17 ENCOUNTER — Emergency Department (HOSPITAL_COMMUNITY)
Admission: EM | Admit: 2017-05-17 | Discharge: 2017-05-17 | Disposition: A | Discharge status: Home / Self Care | Payer: Medicare Other | Attending: Emergency Medicine | Admitting: Emergency Medicine

## 2017-05-17 ENCOUNTER — Encounter (HOSPITAL_COMMUNITY): Payer: Self-pay | Admitting: *Deleted

## 2017-05-17 DIAGNOSIS — K625 Hemorrhage of anus and rectum: Secondary | ICD-10-CM | POA: Diagnosis present

## 2017-05-17 DIAGNOSIS — F17220 Nicotine dependence, chewing tobacco, uncomplicated: Secondary | ICD-10-CM | POA: Insufficient documentation

## 2017-05-17 DIAGNOSIS — Z79899 Other long term (current) drug therapy: Secondary | ICD-10-CM | POA: Insufficient documentation

## 2017-05-17 DIAGNOSIS — D649 Anemia, unspecified: Secondary | ICD-10-CM | POA: Diagnosis not present

## 2017-05-17 DIAGNOSIS — K5903 Drug induced constipation: Secondary | ICD-10-CM | POA: Insufficient documentation

## 2017-05-17 LAB — CBC WITH DIFFERENTIAL/PLATELET
BASOS ABS: 0 10*3/uL (ref 0.0–0.1)
Basophils Relative: 0 %
EOS PCT: 0 %
Eosinophils Absolute: 0 10*3/uL (ref 0.0–0.7)
HCT: 35.5 % — ABNORMAL LOW (ref 36.0–46.0)
HEMOGLOBIN: 11.3 g/dL — AB (ref 12.0–15.0)
LYMPHS ABS: 1.4 10*3/uL (ref 0.7–4.0)
LYMPHS PCT: 25 %
MCH: 28.5 pg (ref 26.0–34.0)
MCHC: 31.8 g/dL (ref 30.0–36.0)
MCV: 89.4 fL (ref 78.0–100.0)
Monocytes Absolute: 0.4 10*3/uL (ref 0.1–1.0)
Monocytes Relative: 7 %
NEUTROS ABS: 3.9 10*3/uL (ref 1.7–7.7)
NEUTROS PCT: 68 %
PLATELETS: 279 10*3/uL (ref 150–400)
RBC: 3.97 MIL/uL (ref 3.87–5.11)
RDW: 13.9 % (ref 11.5–15.5)
WBC: 5.7 10*3/uL (ref 4.0–10.5)

## 2017-05-17 LAB — URINALYSIS, ROUTINE W REFLEX MICROSCOPIC
BILIRUBIN URINE: NEGATIVE
Glucose, UA: NEGATIVE mg/dL
Hgb urine dipstick: NEGATIVE
Ketones, ur: NEGATIVE mg/dL
Leukocytes, UA: NEGATIVE
NITRITE: NEGATIVE
Protein, ur: NEGATIVE mg/dL
Specific Gravity, Urine: 1.009 (ref 1.005–1.030)
pH: 6 (ref 5.0–8.0)

## 2017-05-17 LAB — HEPATIC FUNCTION PANEL
ALBUMIN: 3.6 g/dL (ref 3.5–5.0)
ALK PHOS: 60 U/L (ref 38–126)
ALT: 16 U/L (ref 14–54)
AST: 27 U/L (ref 15–41)
BILIRUBIN INDIRECT: 0.4 mg/dL (ref 0.3–0.9)
Bilirubin, Direct: 0.1 mg/dL (ref 0.1–0.5)
Total Bilirubin: 0.5 mg/dL (ref 0.3–1.2)
Total Protein: 6.8 g/dL (ref 6.5–8.1)

## 2017-05-17 LAB — BASIC METABOLIC PANEL
Anion gap: 10 (ref 5–15)
BUN: 17 mg/dL (ref 6–20)
CHLORIDE: 100 mmol/L — AB (ref 101–111)
CO2: 28 mmol/L (ref 22–32)
CREATININE: 0.98 mg/dL (ref 0.44–1.00)
Calcium: 8.9 mg/dL (ref 8.9–10.3)
GFR calc Af Amer: 58 mL/min — ABNORMAL LOW (ref >60)
GFR calc non Af Amer: 50 mL/min — ABNORMAL LOW (ref >60)
GLUCOSE: 140 mg/dL — AB (ref 65–99)
Potassium: 3.8 mmol/L (ref 3.5–5.1)
Sodium: 138 mmol/L (ref 135–145)

## 2017-05-17 LAB — TYPE AND SCREEN
ABO/RH(D): O POS
Antibody Screen: NEGATIVE

## 2017-05-17 MED ORDER — SENNA 8.6 MG PO TABS: 1.0000 | ORAL_TABLET | Freq: Every day | ORAL | 0 refills | Status: DC

## 2017-05-17 NOTE — ED Triage Notes (Signed)
Pt with abd pain for couple of weeks, gi bleeding started today and bright red in color. Denies nausea

## 2017-05-17 NOTE — ED Provider Notes (Signed)
[facility] DEPT Provider Note   CSN: [id number] Arrival date & time: 05/17/17  1120     History   Chief Complaint Chief Complaint  Patient presents with  . GI Bleeding    HPI Belinda [name] is a 83 y.o. female.  HPI  83 y/o female (pt of Dr. Oneida Alar) has had prior polyps but has not had problems with rectal bleeding - the patient does take 20mg  of oxycodone every 4 hours for chronic pain - has had Complaints of abdominal pain which is chronic, rectal pain which is chronic, states that she is chronically constipated and has recently been prescribed a medication by her physician to battle opioid-induced constipation. She reports that this morning she had a very small amount of bright red blood on the paper when she wiped, there was no other complaints of rectal bleeding. She does not remember the last time she had a normal stool, they're always hard and brown. She denies any urinary symptoms, denies any fevers chills nausea or vomiting.  Past Medical History:  Diagnosis Date  . Anxiety   . Chronic back pain   . Constipation   . GERD (gastroesophageal reflux disease)     Patient Active Problem List   Diagnosis Date Noted  . Hematochezia 03/23/2014  . Unspecified constipation 01/13/2014  . Abdominal wall bulge secondary to atrophic muscle 12/30/2012    Past Surgical History:  Procedure Laterality Date  . ABDOMINAL HYSTERECTOMY    . CESAREAN SECTION    . COLONOSCOPY  April 2009   Dr. Anthony Sar: internal and external hemorrhoids, otherwise normal  . COLONOSCOPY N/A 04/16/2014   [facility]:16 polyps removed/mild diverticulosis/small interal hemorrhoids/bezoar in rectum due to anal stenosis  . EYE SURGERY    . HEMORRHOID SURGERY    . HERNIA REPAIR    . LUMBAR DISC SURGERY    . SLT LASER APPLICATION Right 8/36/6294   Procedure: SLT LASER APPLICATION;  Surgeon: Williams Che, MD;  Location: [facility];  Service: Ophthalmology;  Laterality: Right;    OB History    No data  available       Home Medications    Prior to Admission medications   Medication Sig Start Date End Date Taking? Authorizing Provider  Cholecalciferol (VITAMIN D-3) 5000 UNITS TABS Take 5,000 Units by mouth daily.    [provider]  esomeprazole (NEXIUM) 40 MG capsule Take 40 mg by mouth daily before breakfast.    [provider]  gabapentin (NEURONTIN) 100 MG capsule Take 100 mg by mouth 2 (two) times daily.    [provider]  LORazepam (ATIVAN) 0.5 MG tablet Take 0.5 mg by mouth every 8 (eight) hours.    [provider]  oxyCODONE-acetaminophen (PERCOCET) 10-325 MG per tablet Take 1 tablet by mouth every 4 (four) hours as needed for pain.    [provider]  polyethylene glycol powder (GLYCOLAX/MIRALAX) powder Take 1 capful bid evening for constipation. 04/16/14   Fields, Marga Melnick, MD  zolpidem (AMBIEN) 10 MG tablet Take 10 mg by mouth at bedtime.     [provider]    Family History Family History  Problem Relation Age of Onset  . Stomach cancer Mother   . Colon cancer Neg Hx     Social History Social History  Substance Use Topics  . Smoking status: Never Smoker  . Smokeless tobacco: Current User  . Alcohol use No     Allergies   Patient has no known allergies.   Review of  Systems Review of Systems  All other systems reviewed and are negative.    Physical Exam Updated Vital Signs BP (!) 149/70 (BP Location: Right Arm)   Pulse 84   Temp (!) 97.5 F (36.4 C) (Oral)   Resp 16   Ht 5\' 8"  (1.727 m)   Wt 64.9 kg (143 lb)   SpO2 100%   BMI 21.74 kg/m   Physical Exam  Constitutional: She appears well-developed and well-nourished. No distress.  HENT:  Head: Normocephalic and atraumatic.  Mouth/Throat: Oropharynx is clear and moist. No oropharyngeal exudate.  Eyes: Pupils are equal, round, and reactive to light. Conjunctivae and EOM are normal. Right eye exhibits no discharge. Left eye exhibits no discharge.  No scleral icterus.  Neck: Normal range of motion. Neck supple. No JVD present. No thyromegaly present.  Cardiovascular: Normal rate, regular rhythm, normal heart sounds and intact distal pulses.  Exam reveals no gallop and no friction rub.   No murmur heard. Pulmonary/Chest: Effort normal and breath sounds normal. No respiratory distress. She has no wheezes. She has no rales.  Abdominal: Soft. Bowel sounds are normal. She exhibits no distension and no mass. There is tenderness ( mild diffuse ttp without guarding or peritoneal signs).  Genitourinary:  Genitourinary Comments: Rectal exam with chaperone present - there is no hemorrhoids, no fissues, no masses - in the rectal vault there is a very hard stool - no gross blood - this is faintly hemoccult positive on my exam but no BRB.  Musculoskeletal: Normal range of motion. She exhibits no edema or tenderness.  Lymphadenopathy:    She has no cervical adenopathy.  Neurological: She is alert. Coordination normal.  Skin: Skin is warm and dry. No rash noted. No erythema.  Psychiatric: She has a normal mood and affect. Her behavior is normal.  Nursing note and vitals reviewed.    ED Treatments / Results  Labs (all labs ordered are listed, but only abnormal results are displayed) Labs Reviewed  CBC WITH DIFFERENTIAL/PLATELET - Abnormal; Notable for the following:       Result Value   Hemoglobin 11.3 (*)    HCT 35.5 (*)    All other components within normal limits  BASIC METABOLIC PANEL  HEPATIC FUNCTION PANEL  TYPE AND SCREEN    Radiology No results found.  Procedures Procedures (including critical care time)  Medications Ordered in ED Medications - No data to display   Initial Impression / Assessment and Plan / ED Course  I have reviewed the triage vital signs and the nursing notes.  Pertinent labs & imaging results that were available during my care of the patient were reviewed by me and considered in my medical decision making  (see chart for details).     Bleeding is not necessarily from colon - check UA as well, chronic constipation - scheduled to see Dr. Oneida Alar in 1 month.  The labs are reassuring, Hgb is stable from 10 months ago at 11.3, UA is normal without hematuria and Liver function is normal,  Pt informed Chronic abd pain is likely due to constipation She states she is NOT taking her miralax,  Encouraged this as well as senna Pt and family in agreement.  Vitals:   05/17/17 1123 05/17/17 1145 05/17/17 1330  BP: (!) 149/70  131/70  Pulse: 84 73   Resp: 16 14 12   Temp: (!) 97.5 F (36.4 C)    TempSrc: Oral    SpO2: 100% 96%   Weight: 64.9 kg (143 lb)  Height: 5\' 8"  (1.727 m)        Final Clinical Impressions(s) / ED Diagnoses   Final diagnoses:  Drug-induced constipation  Mild anemia    New Prescriptions New Prescriptions   No medications on file     Noemi Chapel, MD 05/17/17 1357

## 2017-05-17 NOTE — Discharge Instructions (Signed)
Please take the Miralax twice daily and Senna once daily  ER for severe or worsening pain, bleeding or vomiting.  You are likely constipated because of the oxycodone which you take,  ER for severe or worsening symptoms, otherwise have your doctor arrange for close outpatient follow up with your gastroenterologist.

## 2017-06-10 ENCOUNTER — Encounter: Payer: Self-pay | Admitting: *Deleted

## 2017-06-10 ENCOUNTER — Ambulatory Visit (INDEPENDENT_AMBULATORY_CARE_PROVIDER_SITE_OTHER): Payer: Medicare Other | Admitting: Gastroenterology

## 2017-06-10 ENCOUNTER — Encounter: Payer: Self-pay | Admitting: Gastroenterology

## 2017-06-10 VITALS — BP 120/69 | HR 66 | Temp 96.8°F | Ht 68.0 in | Wt 143.0 lb

## 2017-06-10 DIAGNOSIS — D649 Anemia, unspecified: Secondary | ICD-10-CM | POA: Diagnosis not present

## 2017-06-10 DIAGNOSIS — R1032 Left lower quadrant pain: Secondary | ICD-10-CM | POA: Diagnosis not present

## 2017-06-10 DIAGNOSIS — R634 Abnormal weight loss: Secondary | ICD-10-CM | POA: Insufficient documentation

## 2017-06-10 DIAGNOSIS — K5900 Constipation, unspecified: Secondary | ICD-10-CM

## 2017-06-10 NOTE — Progress Notes (Signed)
cc'ed to pcp °

## 2017-06-10 NOTE — Patient Instructions (Signed)
1. Take miralax one capful twice daily along with senokot two tablets daily. Continue regimen unless you develop diarrhea.  2. If this regimen does not work, please call and we can try Trulance.  3. Lab work done at least 1-2 days prior to your CT scan. 4. CT scan is scheduled.

## 2017-06-10 NOTE — Assessment & Plan Note (Signed)
83 year old female presenting with upper quadrant pain, recent ED evaluation for impaction and rectal pain.  Subjectively she felt like her constipation was better managed now on MiraLAX and Senokot.  Tends to have a bowel movement only every 3 days and has to assist herself with passing stool.  Complains of persistent left lower quadrant discomfort.  Has been going on for several months.  Weight is down 20 pounds since last year.  Increase MiraLAX to twice daily along with Senokot 2 daily.  Utilize Fleet enema if needed.  Stressed importance of getting her stools are moving more regularly as she has had bezoar in the rectum in 2015 at time of colonoscopy.  Suspect left lower quadrant pain may be secondary to constipation however given chronicity and weight loss will go ahead and proceed with CT abdomen and pelvis with contrast.  Update CBC ultimately she may require colonoscopy but await CT findings.  If patient continues to have issues passing stool, next option would be Trulance.

## 2017-06-10 NOTE — Progress Notes (Signed)
Primary Care Physician: Octavio Graves, DO  Primary Gastroenterologist:    Chief Complaint  Patient presents with  . Rectal Pain    x 3 weeks. Rectal bleeding 3 weeks ago but none since  . left side pain    going on for a while    HPI: Belinda [name] is a 83 y.o. female here for follow-up.  She was last seen in 2015.  Recently seen in the ED with rectal pain, rectal bleeding and was found to have impaction with hard stool present.  Now complaining of left-sided abdominal pain this been going on for a while.  Patient has history of chronic constipation.  Amitiza caused nausea and vomiting.  Did not tolerate Linzess.  Last colonoscopy August 2015, she had 16 polyps removed, diverticulosis, bezoar in the rectum, anal stenosis.  Due another colonoscopy at this time with overtube if benefits outweigh risk.  Weight down 20 pounds since last year. Forces herself to eat. PCP started her on Movantik daily for two weeks but not effective. Ended up in ED with impaction as outlined. Now on Miralax once daily and sennakot as needed. Feels better. No rectal pain. BM every 3 day, still hard and has to "help" herself. Uses enemas with water at times. Last time yesterday.  Continue to have left lower quadrant pain.  No fever.  No vomiting.  No heartburn, dysphagia.   Current Outpatient Prescriptions  Medication Sig Dispense Refill  . acetaminophen (TYLENOL) 500 MG tablet Take 500 mg by mouth every 6 (six) hours as needed.    . gabapentin (NEURONTIN) 100 MG capsule Take 300 mg by mouth daily.     Marland Kitchen LORazepam (ATIVAN) 0.5 MG tablet Take 0.5 mg by mouth every 8 (eight) hours.    . Oxycodone HCl 20 MG TABS every 6 (six) hours.  0  . polyethylene glycol powder (GLYCOLAX/MIRALAX) powder Take 1 capful bid evening for constipation. (Patient taking differently: Take 1 capful bid evening for constipation as needed) 850 g 11  . senna (SENOKOT) 8.6 MG TABS tablet Take 1 tablet (8.6 mg total) by mouth daily.  Until having soft daily stools 30 tablet 0  . zolpidem (AMBIEN) 10 MG tablet Take 5 mg by mouth at bedtime.      No current facility-administered medications for this visit.     Allergies as of 06/10/2017  . (No Known Allergies)    ROS:  General: Negative for anorexia, weight loss, fever, chills, fatigue, weakness. ENT: Negative for hoarseness, difficulty swallowing , nasal congestion. CV: Negative for chest pain, angina, palpitations, dyspnea on exertion, peripheral edema.  Respiratory: Negative for dyspnea at rest, dyspnea on exertion, cough, sputum, wheezing.  GI: See history of present illness. GU:  Negative for dysuria, hematuria, urinary incontinence, urinary frequency, nocturnal urination.  Endo: Negative for unusual weight change.    Physical Examination:   BP 120/69   Pulse 66   Temp (!) 96.8 F (36 C) (Oral)   Ht 5\' 8"  (1.727 m)   Wt 143 lb (64.9 kg)   BMI 21.74 kg/m   General: Well-nourished, well-developed in no acute distress.  Eyes: No icterus. Mouth: Oropharyngeal mucosa moist and pink , no lesions erythema or exudate. Lungs: Clear to auscultation bilaterally.  Heart: Regular rate and rhythm, no murmurs rubs or gallops.  Abdomen: Bowel sounds are normal, mild to moderate left lower quadrant tenderness, nondistended, no hepatosplenomegaly or masses, no abdominal bruits, no rebound or guarding.  Abdominal wall weakness noted  left mid abdomen with hernia defect.  Nontender, easily reducible. Rectal exam, large amount of firm stool noted up high just in reach, brown heme-negative Extremities: No lower extremity edema. No clubbing or deformities. Neuro: Alert and oriented x 4   Skin: Warm and dry, no jaundice.   Psych: Alert and cooperative, normal mood and affect.  Labs:  Lab Results  Component Value Date   CREATININE 0.98 05/17/2017   BUN 17 05/17/2017   NA 138 05/17/2017   K 3.8 05/17/2017   CL 100 (L) 05/17/2017   CO2 28 05/17/2017   Lab Results    Component Value Date   ALT 16 05/17/2017   AST 27 05/17/2017   ALKPHOS 60 05/17/2017   BILITOT 0.5 05/17/2017   Lab Results  Component Value Date   WBC 5.7 05/17/2017   HGB 11.3 (L) 05/17/2017   HCT 35.5 (L) 05/17/2017   MCV 89.4 05/17/2017   PLT 279 05/17/2017    Imaging Studies: No results found.

## 2017-06-17 LAB — CBC WITH DIFFERENTIAL/PLATELET
BASOS ABS: 22 {cells}/uL (ref 0–200)
Basophils Relative: 0.4 %
EOS PCT: 1.5 %
Eosinophils Absolute: 83 cells/uL (ref 15–500)
HCT: 35.7 % (ref 35.0–45.0)
Hemoglobin: 11.7 g/dL (ref 11.7–15.5)
Lymphs Abs: 2448 cells/uL (ref 850–3900)
MCH: 28.4 pg (ref 27.0–33.0)
MCHC: 32.8 g/dL (ref 32.0–36.0)
MCV: 86.7 fL (ref 80.0–100.0)
MPV: 12.5 fL (ref 7.5–12.5)
Monocytes Relative: 7.5 %
NEUTROS PCT: 46.1 %
Neutro Abs: 2536 cells/uL (ref 1500–7800)
PLATELETS: 279 10*3/uL (ref 140–400)
RBC: 4.12 10*6/uL (ref 3.80–5.10)
RDW: 13 % (ref 11.0–15.0)
TOTAL LYMPHOCYTE: 44.5 %
WBC mixed population: 413 cells/uL (ref 200–950)
WBC: 5.5 10*3/uL (ref 3.8–10.8)

## 2017-06-17 LAB — CREATININE, SERUM: CREATININE: 0.88 mg/dL (ref 0.60–0.88)

## 2017-06-19 ENCOUNTER — Ambulatory Visit (HOSPITAL_COMMUNITY)
Admission: RE | Admit: 2017-06-19 | Discharge: 2017-06-19 | Disposition: A | Discharge status: Home / Self Care | Payer: Medicare Other | Source: Ambulatory Visit | Attending: Gastroenterology | Admitting: Gastroenterology

## 2017-06-19 DIAGNOSIS — R1032 Left lower quadrant pain: Secondary | ICD-10-CM

## 2017-06-19 DIAGNOSIS — R911 Solitary pulmonary nodule: Secondary | ICD-10-CM | POA: Diagnosis not present

## 2017-06-19 DIAGNOSIS — K449 Diaphragmatic hernia without obstruction or gangrene: Secondary | ICD-10-CM | POA: Diagnosis not present

## 2017-06-19 DIAGNOSIS — I700 Atherosclerosis of aorta: Secondary | ICD-10-CM | POA: Diagnosis not present

## 2017-06-19 DIAGNOSIS — R195 Other fecal abnormalities: Secondary | ICD-10-CM | POA: Insufficient documentation

## 2017-06-19 DIAGNOSIS — N281 Cyst of kidney, acquired: Secondary | ICD-10-CM | POA: Diagnosis not present

## 2017-06-19 MED ORDER — IOPAMIDOL (ISOVUE-300) INJECTION 61%
100.0000 mL | Freq: Once | INTRAVENOUS | Status: AC | PRN
  Administered 2017-06-19: 100 mL via INTRAVENOUS

## 2017-06-22 NOTE — Progress Notes (Signed)
CBC normal. See CT result note.

## 2017-06-24 NOTE — Progress Notes (Signed)
Please let patient know that CT with no acute findings and nothing to explained LLQ pain except for possibly related to increased stool throughtout the colon and rectum.   She has mild intra/extraheaptic biliary dilation that may be more from age and pain medication then anything else. Could offer her MRI to make sure nothing blocking the bile duct. It's up to her.  She has nodule in left lower lung slightly increased in size, but limited growth since 2009 therefore likely benign but I would recommend she discuss with her PCP. Send copy to her PCP.   How is her constipation with miralax and sennakot? If she not going good, we can try Trulance.  I would like for her to come back in 2-3 weeks, would consider setting up TCS with overtube then if constipaiton better.

## 2017-06-24 NOTE — Progress Notes (Signed)
Pt is aware of the CBC. No notes seen on the CT report. I told her no acute findings and I will call her when I get more info.

## 2017-06-24 NOTE — Progress Notes (Signed)
Pt is aware of results. Said she is doing better on the [employer], having a good BM once every other day. She does not want the MRI at this time. She will discuss at time of next OV. But will call if any problems before then. She is aware about the lung nodule and that we will send copy to PCP and she should discuss with him. Forwarding to Manuela Schwartz to schedule OV and to send copy of the CT to PCP.

## 2017-06-25 ENCOUNTER — Telehealth: Payer: Self-pay

## 2017-06-25 NOTE — Telephone Encounter (Signed)
Pt's daughter, Stanton Kidney, called to get the results of CT. I went over the results and recommendations with her ( I had informed pt yesterday). She said that pt is doing much better on the Miralax and Senacot. She is not interested in the MRI, but if pt worsens they will let us know.

## 2017-06-26 ENCOUNTER — Encounter: Payer: Self-pay | Admitting: Gastroenterology

## 2017-08-09 ENCOUNTER — Ambulatory Visit: Payer: Medicare Other | Admitting: Gastroenterology

## 2017-12-18 IMAGING — CT CT ABD-PELV W/ CM
2 of 5 series · 15 of 46 positions shown, 17 images · IV contrast (Isovue)
Comparison: Radiographs, 07/19/2016. Abdomen and pelvis CT,
12/05/2012.

CLINICAL DATA: Mid abd pain x 7-8 yrs; constipation; hysterectomy;
hx back surg

EXAM:
CT ABDOMEN AND PELVIS WITH CONTRAST
TECHNIQUE: Multidetector CT imaging of the abdomen and pelvis was performed
using the standard protocol following bolus administration of
intravenous contrast.
CONTRAST:  100mL UNTTXS-R88 IOPAMIDOL (UNTTXS-R88) INJECTION 61%

[Series 2: axial st · axial · 0.65mm/px · z∈[+990,+1415]mm · 12 of 97 slices shown, 14 images]
[im 6/97  soft-tissue]
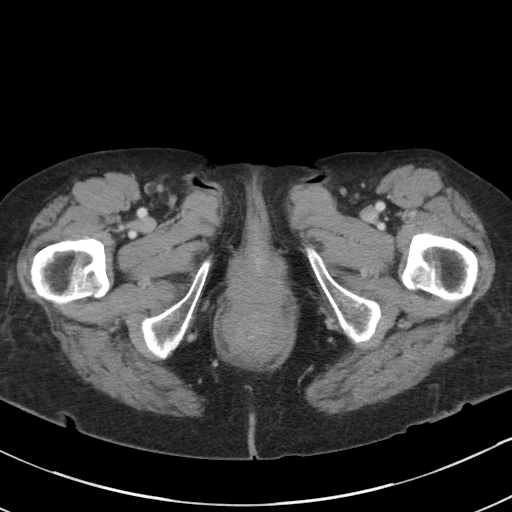
[im 6/97  bone]
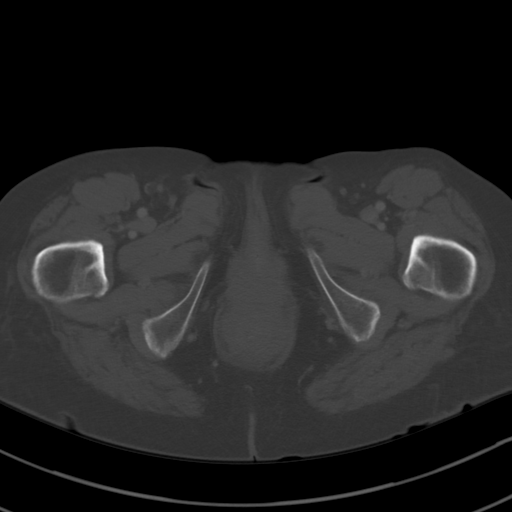
[im 17/97  soft-tissue]
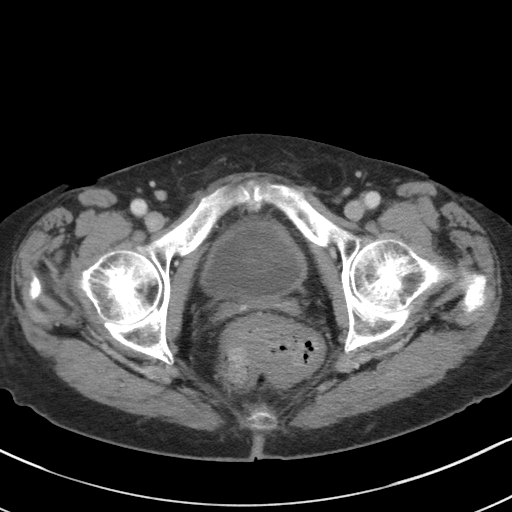
[im 22/97  soft-tissue]
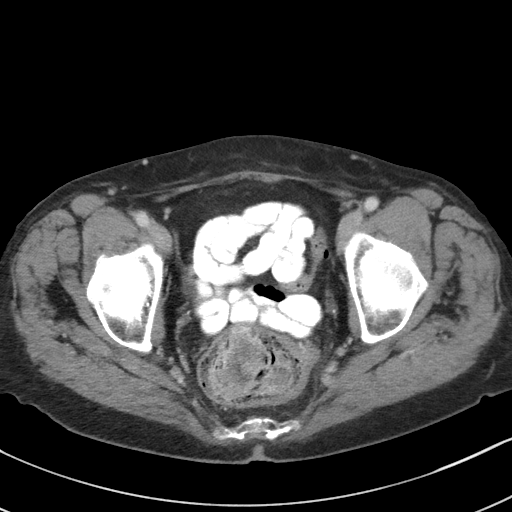
[im 27/97  soft-tissue]
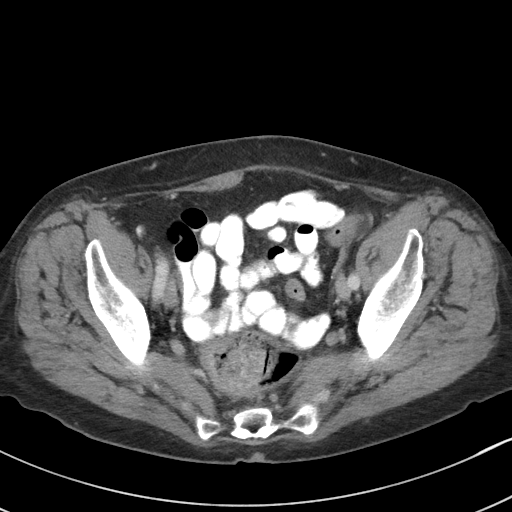
[im 38/97  soft-tissue]
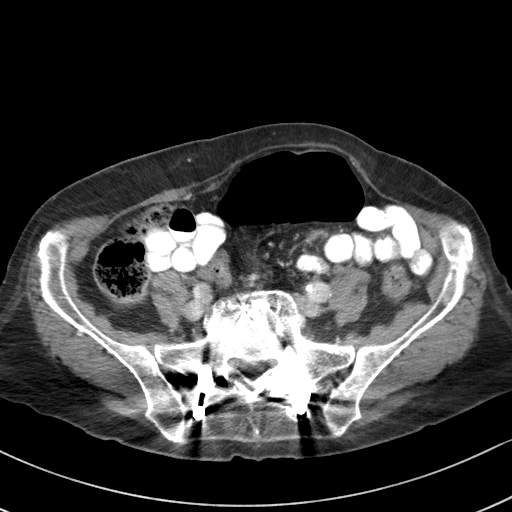
[im 43/97  soft-tissue]
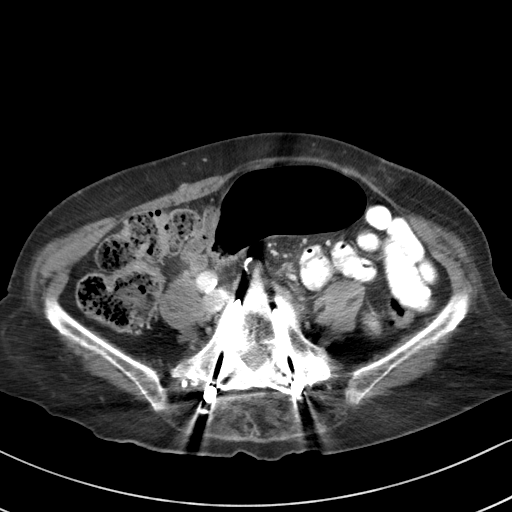
[im 54/97  soft-tissue]
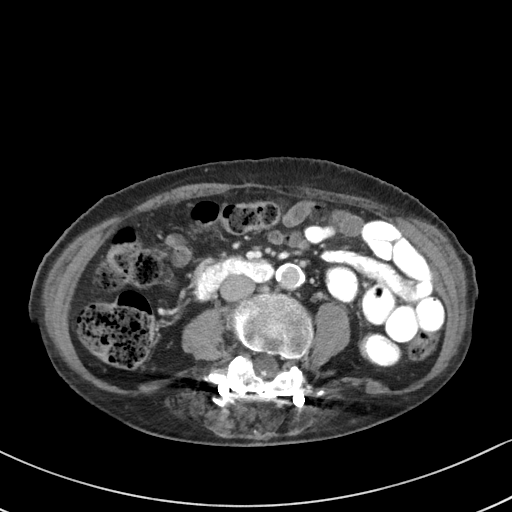
[im 59/97  soft-tissue]
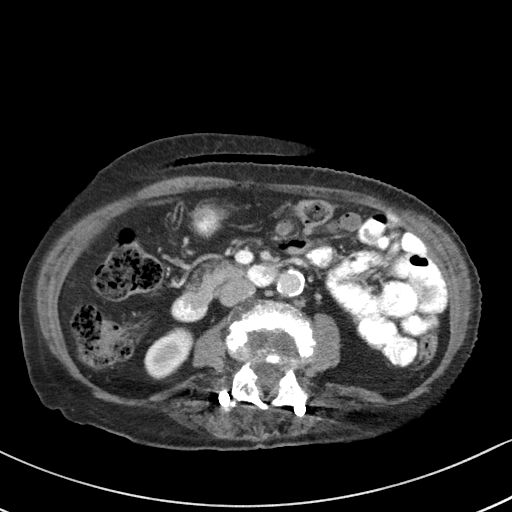
[im 70/97  soft-tissue]
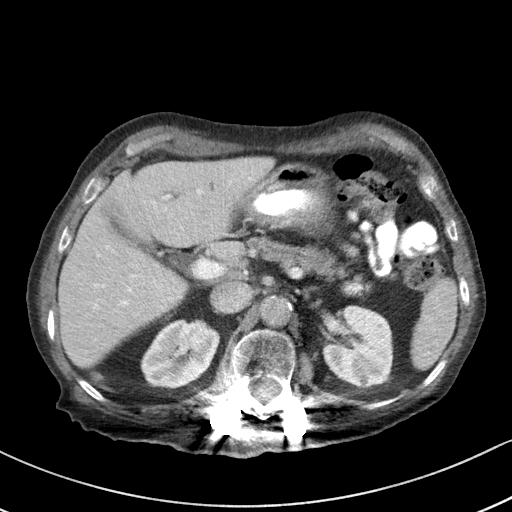
[im 70/97  bone]
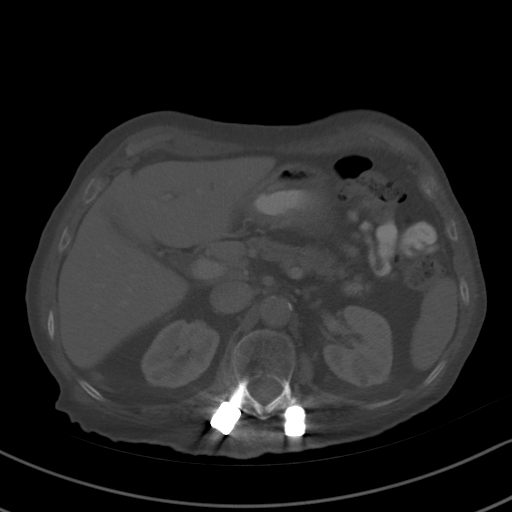
[im 75/97  soft-tissue]
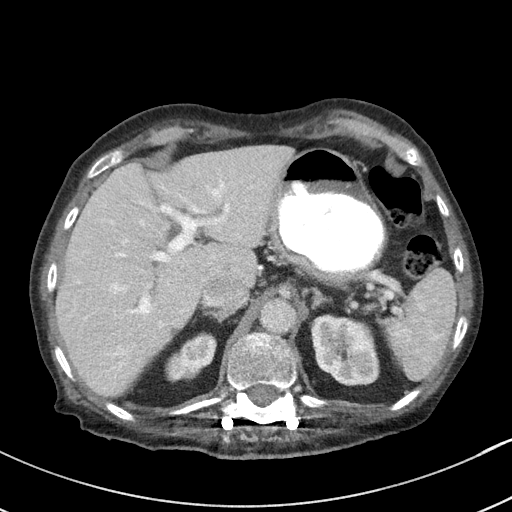
[im 81/97  soft-tissue]
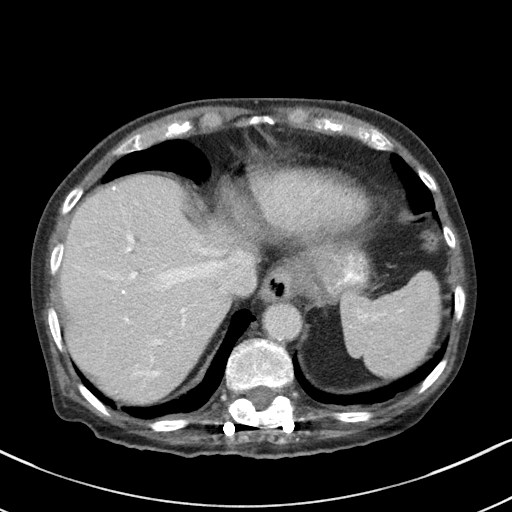
[im 91/97  soft-tissue]
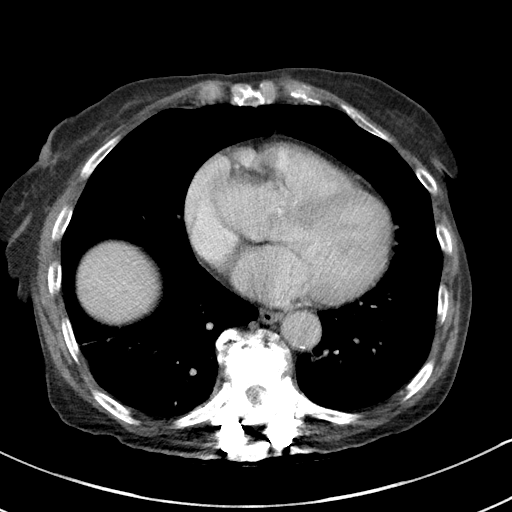

[Series 4: coronal st · coronal · 0.65mm/px · 3 of 86 slices shown]
[im 29/86  soft-tissue]
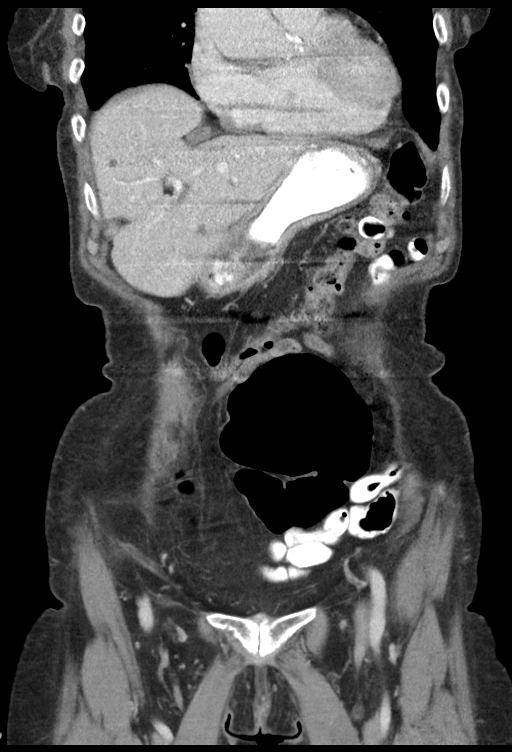
[im 38/86  soft-tissue]
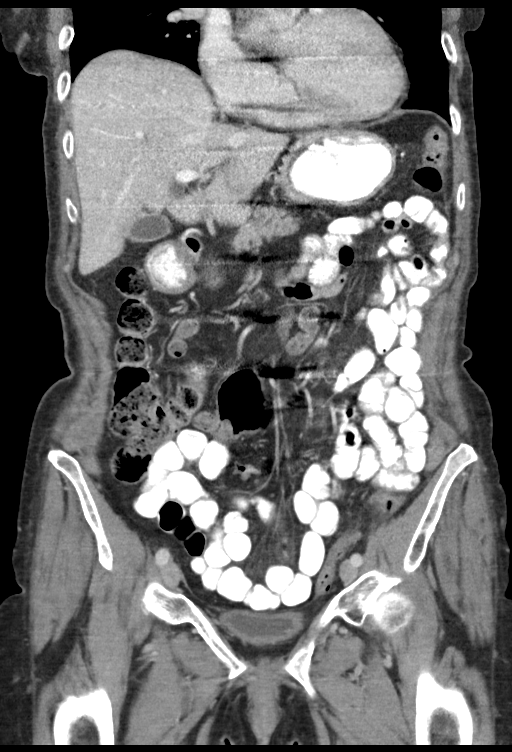
[im 48/86  soft-tissue]
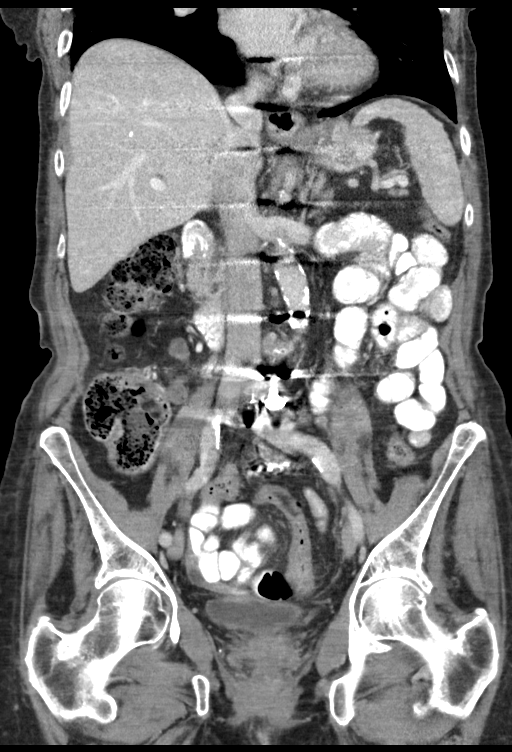

[15 of 46 positions shown; findings below may reference images not displayed]

FINDINGS: Lower chest: Nodule in left lower lobe, centered on image 8, series
6, measuring 15 x 8 x 12 mm, previously 13 x 7 x 11 mm. No acute
abnormalities at the lung bases. Heart is mildly enlarged.

Hepatobiliary: Liver normal in size and overall attenuation. There
are multiple small low-density liver lesions consistent with cysts.
Largest arises from the inferior left lobe measuring 11 mm. These
lesions were present on the prior CT. Several are larger. There is
mild chronic intra and extrahepatic bile duct dilation. Common bile
duct measures 9 mm. No evidence of a duct stone. Normal appearance
of the gallbladder.

Pancreas: Unremarkable. No pancreatic ductal dilatation or
surrounding inflammatory changes.

Spleen: Normal in size without focal abnormality.

Adrenals/Urinary Tract: No adrenal masses.

14 mm exophytic cyst arises from the lateral upper pole the right
kidney, stable dating back to a CT dated 02/24/2008. No other renal
masses, no stones no hydronephrosis. Mild bilateral renal cortical
thinning. Ureters are normal in course and in caliber. Bladder is
unremarkable.

Stomach/Bowel: Small hiatal hernia. Stomach otherwise unremarkable.
Small bowel is normal in caliber. No wall thickening or
inflammation.

Mild increased stool colon. No colonic wall thickening or
inflammation. Probable rectocele.

Vascular/Lymphatic: Atherosclerotic disease throughout the abdominal
aorta extending into the branch vessels. No aneurysm. No adenopathy.

Reproductive: Status post hysterectomy. No adnexal masses.

Other: Diastases of the rectus abdominus muscles with atrophy of the
left rectus abdominus muscle. No hernia. No ascites.

Musculoskeletal: Extensive posterior fusion from T10 through S1 with
bilateral pedicle screws interconnecting rods. The orthopedic
hardware appears well-seated. No fracture or acute finding.
IMPRESSION: 1. No acute findings within the abdomen or pelvis.
2. Small hiatal hernia similar to the prior CT.
3. Left lower lobe nodule, mildly increased in size from the most
recent prior CT. It measured 1 cm greatest dimension on the CT from
9002. This limited growth over the last 9 years strongly supports a
benign etiology.
4. Mild generalized increased stool throughout the colon and in the
rectum. Probable rectocele.
5. Liver and right renal cysts.
6. Aortic atherosclerosis.

## 2022-11-15 ENCOUNTER — Emergency Department (HOSPITAL_COMMUNITY): Payer: Medicare Other

## 2022-11-15 ENCOUNTER — Encounter (HOSPITAL_COMMUNITY): Payer: Self-pay

## 2022-11-15 ENCOUNTER — Other Ambulatory Visit: Payer: Self-pay

## 2022-11-15 ENCOUNTER — Emergency Department (HOSPITAL_COMMUNITY)
Admission: EM | Admit: 2022-11-15 | Discharge: 2022-11-15 | Disposition: A | Discharge status: Home / Self Care | Payer: Medicare Other | Attending: Student | Admitting: Student

## 2022-11-15 DIAGNOSIS — R1032 Left lower quadrant pain: Secondary | ICD-10-CM | POA: Diagnosis present

## 2022-11-15 DIAGNOSIS — E860 Dehydration: Secondary | ICD-10-CM | POA: Insufficient documentation

## 2022-11-15 DIAGNOSIS — K5900 Constipation, unspecified: Secondary | ICD-10-CM | POA: Diagnosis not present

## 2022-11-15 LAB — URINALYSIS, ROUTINE W REFLEX MICROSCOPIC
Bilirubin Urine: NEGATIVE
Glucose, UA: NEGATIVE mg/dL
Hgb urine dipstick: NEGATIVE
Ketones, ur: NEGATIVE mg/dL
Leukocytes,Ua: NEGATIVE
Nitrite: NEGATIVE
Protein, ur: NEGATIVE mg/dL
Specific Gravity, Urine: >1.046 — ABNORMAL HIGH (ref 1.005–1.030)
pH: 6 (ref 5.0–8.0)

## 2022-11-15 LAB — COMPREHENSIVE METABOLIC PANEL
ALT: 10 U/L (ref 0–44)
AST: 19 U/L (ref 15–41)
Albumin: 3.4 g/dL — ABNORMAL LOW (ref 3.5–5.0)
Alkaline Phosphatase: 65 U/L (ref 38–126)
Anion gap: 8 (ref 5–15)
BUN: 14 mg/dL (ref 8–23)
CO2: 24 mmol/L (ref 22–32)
Calcium: 8.7 mg/dL — ABNORMAL LOW (ref 8.9–10.3)
Chloride: 104 mmol/L (ref 98–111)
Creatinine, Ser: 0.88 mg/dL (ref 0.44–1.00)
GFR, Estimated: >60 mL/min (ref >60)
Glucose, Bld: 119 mg/dL — ABNORMAL HIGH (ref 70–99)
Potassium: 3.5 mmol/L (ref 3.5–5.1)
Sodium: 136 mmol/L (ref 135–145)
Total Bilirubin: 0.5 mg/dL (ref 0.3–1.2)
Total Protein: 7.7 g/dL (ref 6.5–8.1)

## 2022-11-15 LAB — CBC WITH DIFFERENTIAL/PLATELET
Abs Immature Granulocytes: 0.01 10*3/uL (ref 0.00–0.07)
Basophils Absolute: 0 10*3/uL (ref 0.0–0.1)
Basophils Relative: 0 %
Eosinophils Absolute: 0.1 10*3/uL (ref 0.0–0.5)
Eosinophils Relative: 2 %
HCT: 38.5 % (ref 36.0–46.0)
Hemoglobin: 12.4 g/dL (ref 12.0–15.0)
Immature Granulocytes: 0 %
Lymphocytes Relative: 48 %
Lymphs Abs: 2.4 10*3/uL (ref 0.7–4.0)
MCH: 29 pg (ref 26.0–34.0)
MCHC: 32.2 g/dL (ref 30.0–36.0)
MCV: 90 fL (ref 80.0–100.0)
Monocytes Absolute: 0.4 10*3/uL (ref 0.1–1.0)
Monocytes Relative: 8 %
Neutro Abs: 2 10*3/uL (ref 1.7–7.7)
Neutrophils Relative %: 42 %
Platelets: 271 10*3/uL (ref 150–400)
RBC: 4.28 MIL/uL (ref 3.87–5.11)
RDW: 15.1 % (ref 11.5–15.5)
WBC: 4.8 10*3/uL (ref 4.0–10.5)
nRBC: 0 % (ref 0.0–0.2)

## 2022-11-15 LAB — LIPASE, BLOOD: Lipase: 36 U/L (ref 11–51)

## 2022-11-15 MED ORDER — MIDAZOLAM HCL 2 MG/2ML IJ SOLN
2.0000 mg | Freq: Once | INTRAMUSCULAR | Status: AC
  Administered 2022-11-15: 2 mg via INTRAVENOUS
  Filled 2022-11-15: qty 2

## 2022-11-15 MED ORDER — GLYCERIN (LAXATIVE) 1 G RE SUPP
3.0000 | Freq: Once | RECTAL | Status: AC
  Administered 2022-11-15: 3 g via RECTAL
  Filled 2022-11-15: qty 3

## 2022-11-15 MED ORDER — SODIUM CHLORIDE 0.9 % IV BOLUS
1000.0000 mL | Freq: Once | INTRAVENOUS | Status: AC
  Administered 2022-11-15: 1000 mL via INTRAVENOUS

## 2022-11-15 MED ORDER — IOHEXOL 300 MG/ML  SOLN
100.0000 mL | Freq: Once | INTRAMUSCULAR | Status: AC | PRN
  Administered 2022-11-15: 100 mL via INTRAVENOUS

## 2022-11-15 NOTE — ED Triage Notes (Signed)
C/O lower left abd pain, radiates around to spine. Family reports that she fell twice yesterday and that she is confused. States sometimes she feels like she is going to bust.

## 2022-11-15 NOTE — ED Provider Notes (Signed)
[facility] Provider Note  CSN: [id number] Arrival date & time: 11/15/22 1229  Chief Complaint(s) Abdominal Pain  HPI Belinda [name] is a 89 y.o. female with PMH chronic back pain on opioid therapy, constipation, GERD who presents emergency room for evaluation of abdominal pain.  Patient states that she has had symptoms for multiple weeks but decided to come get it checked out today.  She states she had trouble sleeping last night due to the pain.  Pain worse in the left lower quadrant but also endorses pain on the right side as well.  Denies dysuria, chest pain, shortness of breath, nausea, vomiting or other systemic symptoms.   Past Medical History Past Medical History:  Diagnosis Date   Anxiety    Chronic back pain    Constipation    GERD (gastroesophageal reflux disease)    Patient Active Problem List   Diagnosis Date Noted   LLQ pain 06/10/2017   Abnormal weight loss 06/10/2017   Anemia 06/10/2017   Hematochezia 03/23/2014   Constipation 01/13/2014   Abdominal wall bulge secondary to atrophic muscle 12/30/2012   Home Medication(s) Prior to Admission medications   Medication Sig Start Date End Date Taking? Authorizing Provider  acetaminophen (TYLENOL) 500 MG tablet Take 500 mg by mouth every 6 (six) hours as needed.   Yes [provider]  citalopram (CELEXA) 20 MG tablet Take 1 tablet by mouth every morning. 07/18/20  Yes [provider]  LORazepam (ATIVAN) 0.5 MG tablet Take 0.5 mg by mouth See admin instructions. Take 1 tablet in the morning and 1 tablet every evening   Yes [provider]  melatonin 3 MG TABS tablet Take by mouth. 08/06/20  Yes [provider]  oxyCODONE (ROXICODONE) 15 MG immediate release tablet Take 15 mg by mouth every 6 (six) hours as needed for pain. 07/06/20  Yes [provider]  polyethylene glycol powder (GLYCOLAX/MIRALAX) powder Take 1 capful bid evening  for constipation. Patient taking differently: Take 1 capful bid evening for constipation as needed 04/16/14  Yes Danie Binder, MD                                                                                                                                    Past Surgical History Past Surgical History:  Procedure Laterality Date   ABDOMINAL HYSTERECTOMY     CESAREAN SECTION     COLONOSCOPY  April 2009   Dr. Anthony Sar: internal and external hemorrhoids, otherwise normal   COLONOSCOPY N/A 04/16/2014   SLF:16 polyps removed/mild diverticulosis/small interal hemorrhoids/bezoar in rectum due to anal stenosis   EYE SURGERY     HEMORRHOID SURGERY     HERNIA REPAIR     LUMBAR [place]     SLT LASER APPLICATION Right [id number]   Procedure: SLT LASER APPLICATION;  Surgeon: Williams Che, MD;  Location: [facility];  Service: Ophthalmology;  Laterality: Right;   Family History Family History  Problem Relation Age of Onset   Stomach cancer Mother    Colon cancer Neg Hx     Social History Social History   Tobacco Use   Smoking status: Never   Smokeless tobacco: Current  Substance Use Topics   Alcohol use: No   Drug use: No   Allergies Patient has no known allergies.  Review of Systems Review of Systems  Gastrointestinal:  Positive for abdominal pain and constipation.    Physical Exam Vital Signs  I have reviewed the triage vital signs BP (!) 146/86   Pulse 63   Temp (!) 97.5 F (36.4 C) (Oral)   Resp 20   SpO2 97%   Physical Exam Vitals and nursing note reviewed.  Constitutional:      General: She is not in acute distress.    Appearance: She is well-developed.  HENT:     Head: Normocephalic and atraumatic.  Eyes:     Conjunctiva/sclera: Conjunctivae normal.  Cardiovascular:     Rate and Rhythm: Normal rate and regular rhythm.     Heart sounds: No murmur heard. Pulmonary:     Effort: Pulmonary effort is normal. No respiratory distress.     Breath sounds:  Normal breath sounds.  Abdominal:     Palpations: Abdomen is soft.     Tenderness: There is abdominal tenderness in the right lower quadrant and left lower quadrant.  Musculoskeletal:        General: No swelling.     Cervical back: Neck supple.  Skin:    General: Skin is warm and dry.     Capillary Refill: Capillary refill takes less than 2 seconds.  Neurological:     Mental Status: She is alert.  Psychiatric:        Mood and Affect: Mood normal.     ED Results and Treatments Labs (all labs ordered are listed, but only abnormal results are displayed) Labs Reviewed  COMPREHENSIVE METABOLIC PANEL - Abnormal; Notable for the following components:      Result Value   Glucose, Bld 119 (*)    Calcium 8.7 (*)    Albumin 3.4 (*)    All other components within normal limits  URINALYSIS, ROUTINE W REFLEX MICROSCOPIC - Abnormal; Notable for the following components:   Specific Gravity, Urine >1.046 (*)    All other components within normal limits  CBC WITH DIFFERENTIAL/PLATELET  LIPASE, BLOOD                                                                                                                          Radiology CT ABDOMEN PELVIS W CONTRAST  Result Date: 11/15/2022 CLINICAL DATA:  Left lower quadrant abdominal pain radiating around to the spine. Two falls yesterday. EXAM: CT ABDOMEN AND PELVIS WITH CONTRAST TECHNIQUE: Multidetector CT imaging of the abdomen and pelvis was performed using the standard protocol following bolus administration of intravenous contrast. RADIATION DOSE REDUCTION: This exam  was performed according to the departmental dose-optimization program which includes automated exposure control, adjustment of the mA and/or kV according to patient size and/or use of iterative reconstruction technique. CONTRAST:  178mL OMNIPAQUE IOHEXOL 300 MG/ML  SOLN COMPARISON:  08/02/2020 report, prior images from 10/30 1/18 FINDINGS: Lower chest: Ascending aortic aneurysm 4.2 cm  in diameter, image 7 series 2. This is incompletely characterized. The main pulmonary artery is also prominent. Descending thoracic aortic atherosclerotic vascular calcification. Stable scarring, right lower lobe. Left lower lobe nodule 1.7 by 1.2 cm on image 17 series 5, previously 1.6 by 1.3 cm on 06/19/2017, stability over the last 6 years favors benign etiology. Because of the long-term stability establishing the benign nature of this nodule, this nodule is not considered "incidental" and accordingly Fleischner guidelines for follow up CT imaging of the chest do not apply. That said, CT of the chest is likely warranted to further assess the thoracic aortic aneurysm. Small type 1 hiatal hernia. Hepatobiliary: Stable hepatic cysts. No further imaging workup of these lesions is indicated. Gallbladder unremarkable. Common bile duct measures up to 8 mm in diameter, within normal limits given the patient's age. Pancreas: Borderline prominence of the dorsal pancreatic duct in the pancreatic head. Spleen: Unremarkable Adrenals/Urinary Tract: No significant renal lesion. Adrenal glands unremarkable. Urinary bladder unremarkable. Stomach/Bowel: Large stool ball in the rectum and another large stool ball in the distal sigmoid colon. Rectum diameter 8.9 cm transverse. Appearance highly suspicious for fecal impaction. No definite wall thickening to indicate stercoral colitis. Mild sigmoid colon diverticulosis. Vascular/Lymphatic: Atherosclerosis is present, including aortoiliac atherosclerotic disease. Chronically fatty lymph node or cluster of lymph nodes in the left external iliac chain is thought to be benign given long-term stability. Reproductive: Uterus absent.  Adnexa unremarkable. Other: No supplemental non-categorized findings. Musculoskeletal: Healing fractures of the right pubic rami, with bridging callus noted. Left hip hemiarthroplasty noted. Do not see a well-defined sacral fracture. Posterolateral rod and  pedicle screw fixation T10 through S1, with discontinuity in the left posterolateral rod just below the L4 pedicle and in the right posterolateral rod just above the L4 pedicle. No lucency around the screws. There is also evidence of anterior fusion at L4-5 and L5-S1 IMPRESSION: 1. Large stool balls in the rectum and distal sigmoid colon, highly suspicious for fecal impaction. No wall thickening to indicate stercoral colitis. 2. Healing fractures of the right pubic rami, with bridging callus noted. 3. Discontinuity of the left posterolateral rod just below the L4 pedicle and in the right posterolateral rod just above the L4 pedicle. 4. Ascending aortic aneurysm 4.2 cm in diameter. This is incompletely characterized on today's exam and is best I can tell has not been characterized on prior imaging. Follow up CT angiography of the chest is recommended for definitive assessment to fully characterize the thoracic aorta. 5. Prominent main pulmonary artery, suggesting pulmonary arterial hypertension. 6. Stable 1.7 by 1.2 cm left lower lobe pulmonary nodule, stability over the last 6 years compatible with benign etiology. 7. Small type 1 hiatal hernia. 8. Mild sigmoid colon diverticulosis. 9. Stable hepatic cysts. 10. Aortic atherosclerosis. Aortic Atherosclerosis (ICD10-I70.0). Electronically Signed   By: Van Clines M.D.   On: 11/15/2022 14:57    Pertinent labs & imaging results that were available during my care of the patient were reviewed by me and considered in my medical decision making (see MDM for details).  Medications Ordered in ED Medications  iohexol (OMNIPAQUE) 300 MG/ML solution 100 mL (100 mLs Intravenous Contrast Given  11/15/22 1404)  midazolam (VERSED) injection 2 mg (2 mg Intravenous Given 11/15/22 1526)  glycerin (Pediatric) 1 g suppository 3 g (3 g Rectal Given 11/15/22 1540)  sodium chloride 0.9 % bolus 1,000 mL (0 mLs Intravenous Stopped 11/15/22 1744)                                                                                                                                      Procedures Fecal disimpaction  Date/Time: 11/15/2022 8:28 PM  Performed by: Teressa Lower, MD Authorized by: Teressa Lower, MD  Consent: Verbal consent obtained. Consent given by: power of attorney Local anesthesia used: no  Anesthesia: Local anesthesia used: no  Sedation: Patient sedated: yes Sedation type: anxiolysis Sedatives: midazolam  Patient tolerance: patient tolerated the procedure well with no immediate complications     (including critical care time)  Medical Decision Making / ED Course   This patient presents to the ED for concern of abdominal pain, this involves an extensive number of treatment options, and is a complaint that carries with it a high risk of complications and morbidity.  The differential diagnosis includes diverticulitis, epiploic appendagitis, colitis, gastroenteritis, constipation, nephrolithiasis, inflammatory bowel disease,  MDM: Patient seen emergency room for evaluation of abdominal pain.  Physical exam with tenderness in the right and left lower quadrants but is otherwise unremarkable.  Laboratory evaluation unremarkable.  CT abdomen pelvis with large stool balls in the rectum but no evidence of stercoral colitis, discontinuity of the posterolateral rod is below the L4 pedicle and in the right posterolateral rod above the L4 pedicle, 4.2 cm ascending aortic aneurysm that will require outpatient follow-up, pulmonary arterial hypertension, stable left lower lobe pulmonary nodule, diverticulosis.  Patient's pain is primarily in the left lower quadrant and almost certainly secondary to her significant constipation with large stool balls and I have very low suspicion for acute aortic pathology today and thus we did not pursue additional CT angiography.  Soapsuds enema attempted and patient had mild evacuation of stool but not enough to fully address her  constipation and pain.  Anxiolysis given and I personally performed a manual disimpaction attempting to evacuate the large stool balls.  Unfortunately, the patients rectum is small in diameter and I was unable to fully evacuate the large stool balls.  I was however able to break up the hard stool balls and we will attempt to treat the patient's constipation with multiple glycerin suppositories.  After this procedure, I spoke with the patient's daughter who states that they are concerned they might not be able to care for her at home and were requesting that we place the patient in a nursing home.  I explained to the patient that they will need to speak with her primary care physician to help transition the patient to a nursing home as the patient is still functional, able to ambulate on her own and is quite lucid here in  the emergency department.  At time of signout, patient pending urinalysis.  Please see provider signout continuation of workup.  Anticipate discharge home  Additional history obtained: -Additional history obtained from daughter -External records from outside source obtained and reviewed including: Chart review including previous notes, labs, imaging, consultation notes   Lab Tests: -I ordered, reviewed, and interpreted labs.   The pertinent results include:   Labs Reviewed  COMPREHENSIVE METABOLIC PANEL - Abnormal; Notable for the following components:      Result Value   Glucose, Bld 119 (*)    Calcium 8.7 (*)    Albumin 3.4 (*)    All other components within normal limits  URINALYSIS, ROUTINE W REFLEX MICROSCOPIC - Abnormal; Notable for the following components:   Specific Gravity, Urine >1.046 (*)    All other components within normal limits  CBC WITH DIFFERENTIAL/PLATELET  LIPASE, BLOOD     Imaging Studies ordered: I ordered imaging studies including CT abdomen pelvis I independently visualized and interpreted imaging. I agree with the radiologist  interpretation   Medicines ordered and prescription drug management: Meds ordered this encounter  Medications   iohexol (OMNIPAQUE) 300 MG/ML solution 100 mL   midazolam (VERSED) injection 2 mg   glycerin (Pediatric) 1 g suppository 3 g   sodium chloride 0.9 % bolus 1,000 mL    -I have reviewed the patients home medicines and have made adjustments as needed  Critical interventions none   Cardiac Monitoring: The patient was maintained on a cardiac monitor.  I personally viewed and interpreted the cardiac monitored which showed an underlying rhythm of: NSR  Social Determinants of Health:  Factors impacting patients care include: none   Reevaluation: After the interventions noted above, I reevaluated the patient and found that they have :improved  Co morbidities that complicate the patient evaluation  Past Medical History:  Diagnosis Date   Anxiety    Chronic back pain    Constipation    GERD (gastroesophageal reflux disease)       Dispostion: I considered admission for this patient, and disposition pending urinalysis.  Please see provider signout for continuation of workup.     Final Clinical Impression(s) / ED Diagnoses Final diagnoses:  Constipation, unspecified constipation type  Dehydration     @PCDICTATION @    Teressa Lower, MD 11/15/22 2034

## 2022-11-15 NOTE — ED Notes (Signed)
Patient transported to CT 

## 2022-11-15 NOTE — Discharge Instructions (Signed)
Take your miralax twice a day every day.  Follow up with your md next week for recheck

## 2024-05-03 ENCOUNTER — Other Ambulatory Visit: Payer: Self-pay

## 2024-05-03 ENCOUNTER — Observation Stay (HOSPITAL_COMMUNITY)
Admission: EM | Admit: 2024-05-03 | Discharge: 2024-05-06 | Disposition: A | Discharge status: Home / Self Care | Source: Skilled Nursing Facility | Attending: Internal Medicine | Admitting: Internal Medicine

## 2024-05-03 ENCOUNTER — Emergency Department (HOSPITAL_COMMUNITY)

## 2024-05-03 DIAGNOSIS — E871 Hypo-osmolality and hyponatremia: Secondary | ICD-10-CM | POA: Diagnosis not present

## 2024-05-03 DIAGNOSIS — G8929 Other chronic pain: Secondary | ICD-10-CM | POA: Diagnosis not present

## 2024-05-03 DIAGNOSIS — K5641 Fecal impaction: Principal | ICD-10-CM | POA: Diagnosis present

## 2024-05-03 DIAGNOSIS — F1120 Opioid dependence, uncomplicated: Secondary | ICD-10-CM | POA: Insufficient documentation

## 2024-05-03 DIAGNOSIS — K5900 Constipation, unspecified: Secondary | ICD-10-CM

## 2024-05-03 DIAGNOSIS — M6281 Muscle weakness (generalized): Secondary | ICD-10-CM | POA: Diagnosis not present

## 2024-05-03 DIAGNOSIS — R109 Unspecified abdominal pain: Secondary | ICD-10-CM | POA: Diagnosis present

## 2024-05-03 DIAGNOSIS — Z79899 Other long term (current) drug therapy: Secondary | ICD-10-CM | POA: Insufficient documentation

## 2024-05-03 DIAGNOSIS — F419 Anxiety disorder, unspecified: Secondary | ICD-10-CM | POA: Insufficient documentation

## 2024-05-03 DIAGNOSIS — Z7901 Long term (current) use of anticoagulants: Secondary | ICD-10-CM | POA: Insufficient documentation

## 2024-05-03 DIAGNOSIS — R2689 Other abnormalities of gait and mobility: Secondary | ICD-10-CM | POA: Insufficient documentation

## 2024-05-03 LAB — COMPREHENSIVE METABOLIC PANEL WITH GFR
ALT: 8 U/L (ref 0–44)
AST: 19 U/L (ref 15–41)
Albumin: 3.2 g/dL — ABNORMAL LOW (ref 3.5–5.0)
Alkaline Phosphatase: 48 U/L (ref 38–126)
Anion gap: 6 (ref 5–15)
BUN: 11 mg/dL (ref 8–23)
CO2: 24 mmol/L (ref 22–32)
Calcium: 7.8 mg/dL — ABNORMAL LOW (ref 8.9–10.3)
Chloride: 101 mmol/L (ref 98–111)
Creatinine, Ser: 0.84 mg/dL (ref 0.44–1.00)
GFR, Estimated: >60 mL/min (ref >60)
Glucose, Bld: 94 mg/dL (ref 70–99)
Potassium: 4 mmol/L (ref 3.5–5.1)
Sodium: 131 mmol/L — ABNORMAL LOW (ref 135–145)
Total Bilirubin: 0.3 mg/dL (ref 0.0–1.2)
Total Protein: 6.5 g/dL (ref 6.5–8.1)

## 2024-05-03 LAB — CBC WITH DIFFERENTIAL/PLATELET
Abs Immature Granulocytes: 0.01 K/uL (ref 0.00–0.07)
Basophils Absolute: 0 K/uL (ref 0.0–0.1)
Basophils Relative: 0 %
Eosinophils Absolute: 0 K/uL (ref 0.0–0.5)
Eosinophils Relative: 1 %
HCT: 31.9 % — ABNORMAL LOW (ref 36.0–46.0)
Hemoglobin: 10.2 g/dL — ABNORMAL LOW (ref 12.0–15.0)
Immature Granulocytes: 0 %
Lymphocytes Relative: 49 %
Lymphs Abs: 2 K/uL (ref 0.7–4.0)
MCH: 29.3 pg (ref 26.0–34.0)
MCHC: 32 g/dL (ref 30.0–36.0)
MCV: 91.7 fL (ref 80.0–100.0)
Monocytes Absolute: 0.4 K/uL (ref 0.1–1.0)
Monocytes Relative: 9 %
Neutro Abs: 1.7 K/uL (ref 1.7–7.7)
Neutrophils Relative %: 41 %
Platelets: 272 K/uL (ref 150–400)
RBC: 3.48 MIL/uL — ABNORMAL LOW (ref 3.87–5.11)
RDW: 14.1 % (ref 11.5–15.5)
WBC: 4.2 K/uL (ref 4.0–10.5)
nRBC: 0 % (ref 0.0–0.2)

## 2024-05-03 LAB — URINALYSIS, ROUTINE W REFLEX MICROSCOPIC
Bilirubin Urine: NEGATIVE
Glucose, UA: NEGATIVE mg/dL
Hgb urine dipstick: NEGATIVE
Ketones, ur: NEGATIVE mg/dL
Leukocytes,Ua: NEGATIVE
Nitrite: NEGATIVE
Protein, ur: NEGATIVE mg/dL
Specific Gravity, Urine: 1.041 — ABNORMAL HIGH (ref 1.005–1.030)
pH: 6 (ref 5.0–8.0)

## 2024-05-03 MED ORDER — MELATONIN 3 MG PO TABS
3.0000 mg | ORAL_TABLET | Freq: Every day | ORAL | Status: DC
  Administered 2024-05-03 – 2024-05-05 (×3): 3 mg via ORAL
  Filled 2024-05-03 (×3): qty 1

## 2024-05-03 MED ORDER — FENTANYL CITRATE (PF) 100 MCG/2ML IJ SOLN
50.0000 ug | Freq: Once | INTRAMUSCULAR | Status: AC
  Administered 2024-05-03: 50 ug via INTRAVENOUS
  Filled 2024-05-03: qty 2

## 2024-05-03 MED ORDER — SENNOSIDES-DOCUSATE SODIUM 8.6-50 MG PO TABS
1.0000 | ORAL_TABLET | Freq: Two times a day (BID) | ORAL | Status: DC
  Administered 2024-05-03 – 2024-05-06 (×6): 1 via ORAL
  Filled 2024-05-03 (×6): qty 1

## 2024-05-03 MED ORDER — ONDANSETRON HCL 4 MG/2ML IJ SOLN: 4.0000 mg | Freq: Four times a day (QID) | INTRAMUSCULAR | Status: DC | PRN

## 2024-05-03 MED ORDER — FLEET ENEMA RE ENEM
1.0000 | ENEMA | Freq: Once | RECTAL | Status: AC
  Administered 2024-05-04: 1 via RECTAL

## 2024-05-03 MED ORDER — POLYETHYLENE GLYCOL 3350 17 G PO PACK
17.0000 g | PACK | Freq: Two times a day (BID) | ORAL | Status: DC
  Administered 2024-05-03 – 2024-05-06 (×6): 17 g via ORAL
  Filled 2024-05-03 (×6): qty 1

## 2024-05-03 MED ORDER — ACETAMINOPHEN 325 MG PO TABS
650.0000 mg | ORAL_TABLET | Freq: Four times a day (QID) | ORAL | Status: DC | PRN
  Administered 2024-05-04 – 2024-05-05 (×3): 650 mg via ORAL
  Filled 2024-05-03 (×3): qty 2

## 2024-05-03 MED ORDER — ENOXAPARIN SODIUM 40 MG/0.4ML IJ SOSY
40.0000 mg | PREFILLED_SYRINGE | INTRAMUSCULAR | Status: DC
  Administered 2024-05-04 – 2024-05-06 (×3): 40 mg via SUBCUTANEOUS
  Filled 2024-05-03 (×3): qty 0.4

## 2024-05-03 MED ORDER — ACETAMINOPHEN 650 MG RE SUPP: 650.0000 mg | Freq: Four times a day (QID) | RECTAL | Status: DC | PRN

## 2024-05-03 MED ORDER — ONDANSETRON HCL 4 MG PO TABS: 4.0000 mg | ORAL_TABLET | Freq: Four times a day (QID) | ORAL | Status: DC | PRN

## 2024-05-03 MED ORDER — LORAZEPAM 0.5 MG PO TABS
0.5000 mg | ORAL_TABLET | Freq: Two times a day (BID) | ORAL | Status: DC | PRN
  Administered 2024-05-03 – 2024-05-05 (×3): 0.5 mg via ORAL
  Filled 2024-05-03 (×3): qty 1

## 2024-05-03 MED ORDER — SODIUM CHLORIDE 0.9 % IV SOLN: INTRAVENOUS | Status: AC

## 2024-05-03 MED ORDER — IOHEXOL 300 MG/ML  SOLN
100.0000 mL | Freq: Once | INTRAMUSCULAR | Status: AC | PRN
  Administered 2024-05-03: 100 mL via INTRAVENOUS

## 2024-05-03 NOTE — Assessment & Plan Note (Addendum)
 Presenting with lower abdominal pain radiating to back and rectum.  Patient is on chronic oxycodone.  And has not been compliant with stool softeners.   - CT shows - Large amount of dense stool within the rectal vault suggestive of fecal impaction. No bowel obstruction. -EDP attempted disimpaction in the ED-a lot of hard stool, only a small amount could be removed. -Family and patient declined when I offered to try disimpaction again, as they report the first one was very uncomfortable for patient. They would rather wait till morning after another enemas. -Fleet enema, MiraLAX , Senokot -If no bowel movement by a.m., patient may need GI consult - Emphasized need for patient to be compliant with bowel regimen, may benefit from Linzess  or similar

## 2024-05-03 NOTE — H&P (Signed)
 History and Physical    [name]:[id number] DOB: [birth year] DOA: 05/03/2024  PCP: Renato Dorothey HERO, NP   Patient coming from: Home  I have personally briefly reviewed patient's old medical records in [facility]  Chief Complaint: Abdominal pain  HPI: Belinda [name] is a 89 y.o. female with medical history significant for chronic back pain, anxiety.  Patient is very hard of hearing, history is obtained with the help of her sister and daughter at bedside. Patient presented to the ED with complaints of lower abdominal pain rating to the back and rectum started today.  Patient has a history of chronic constipation requiring enemas for bowel movement.  She reports yesterday she used an enema before she was able to have a bowel movement.  Reports it was a good amount. No vomiting, fevers no chills.  No urinary symptoms. Patient is on oxycodone twice a day, she is supposed to be on MiraLAX , but does not take it.  ED Course: Temperature 97.8.  Heart rate 59-70.  Respirate rate 17-18.  Blood pressure systolic 127-149.  O2 sat greater 96% on room air. WBC 4.2.   CT shows-large amount of dense stool within the rectal vault suggestive of fecal impaction.  No bowel obstruction. Rectal disimpaction attempted in ED, there was a lot of hard stool but EDP was only able to remove a small amount.  Review of Systems: As per HPI all other systems reviewed and negative.  Past Medical History:  Diagnosis Date   Anxiety    Chronic back pain    Constipation    GERD (gastroesophageal reflux disease)     Past Surgical History:  Procedure Laterality Date   ABDOMINAL HYSTERECTOMY     CESAREAN SECTION     COLONOSCOPY  April 2009   Dr. Ivery: internal and external hemorrhoids, otherwise normal   COLONOSCOPY N/A 04/16/2014   SLF:16 polyps removed/mild diverticulosis/small interal hemorrhoids/bezoar in rectum due to anal stenosis   EYE SURGERY     HEMORRHOID SURGERY     HERNIA REPAIR      LUMBAR DISC SURGERY     SLT LASER APPLICATION Right 04/19/2014   Procedure: SLT LASER APPLICATION;  Surgeon: Dow JULIANNA Burke, MD;  Location: [facility];  Service: Ophthalmology;  Laterality: Right;     reports that she has never smoked. She uses smokeless tobacco. She reports that she does not drink alcohol and does not use drugs.  No Known Allergies  Family History  Problem Relation Age of Onset   Stomach cancer Mother    Colon cancer Neg Hx     Prior to Admission medications   Medication Sig Start Date End Date Taking? Authorizing Provider  acetaminophen  (TYLENOL ) 500 MG tablet Take 500 mg by mouth every 6 (six) hours as needed.    [provider]  citalopram (CELEXA) 20 MG tablet Take 1 tablet by mouth every morning. 07/18/20   [provider]  LORazepam  (ATIVAN ) 0.5 MG tablet Take 0.5 mg by mouth See admin instructions. Take 1 tablet in the morning and 1 tablet every evening    [provider]  melatonin 3 MG TABS tablet Take by mouth. 08/06/20   [provider]  oxyCODONE (ROXICODONE) 15 MG immediate release tablet Take 15 mg by mouth every 6 (six) hours as needed for pain. 07/06/20   [provider]  polyethylene glycol powder (GLYCOLAX /MIRALAX ) powder Take 1 capful bid evening for constipation. Patient taking differently: Take 1 capful bid evening for constipation  as needed 04/16/14   Harvey Margo CROME, MD    Physical Exam: Vitals:   05/03/24 1150 05/03/24 1154 05/03/24 1156 05/03/24 1625  BP:      Pulse: 69     Resp: 17     Temp:  97.8 F (36.6 C)  97.9 F (36.6 C)  TempSrc:  Oral  Oral  SpO2: 97%  97%     Constitutional: NAD, calm, comfortable Vitals:   05/03/24 1150 05/03/24 1154 05/03/24 1156 05/03/24 1625  BP:      Pulse: 69     Resp: 17     Temp:  97.8 F (36.6 C)  97.9 F (36.6 C)  TempSrc:  Oral  Oral  SpO2: 97%  97%    Eyes: PERRL, lids and conjunctivae normal ENMT: Mucous membranes are moist. Posterior pharynx  clear of any exudate or lesions.Normal dentition.  Neck: normal, supple, no masses, no thyromegaly Respiratory: clear to auscultation bilaterally, no wheezing, no crackles. Normal respiratory effort. No accessory muscle use.  Cardiovascular: Regular rate and rhythm, no murmurs / rubs / gallops. No extremity edema.  Extremities warm. Abdomen: no tenderness, no masses palpated. No hepatosplenomegaly. Bowel sounds positive.  Musculoskeletal: no clubbing / cyanosis. No joint deformity upper and lower extremities.  Skin: no rashes, lesions, ulcers. No induration Neurologic: Hard of hearing, no facial asymmetry, moving extremity spontaneously, speech fluent. Psychiatric: Normal judgment and insight. Alert and oriented x 3. Normal mood.   Labs on Admission: I have personally reviewed following labs and imaging studies  CBC: Recent Labs  Lab 05/03/24 1342  WBC 4.2  NEUTROABS 1.7  HGB 10.2*  HCT 31.9*  MCV 91.7  PLT 272   Basic Metabolic Panel: Recent Labs  Lab 05/03/24 1342  NA 131*  K 4.0  CL 101  CO2 24  GLUCOSE 94  BUN 11  CREATININE 0.84  CALCIUM 7.8*   GFR: CrCl cannot be calculated (Unknown ideal weight.). Liver Function Tests: Recent Labs  Lab 05/03/24 1342  AST 19  ALT 8  ALKPHOS 48  BILITOT 0.3  PROT 6.5  ALBUMIN 3.2*    Radiological Exams on Admission: CT ABDOMEN PELVIS W CONTRAST Result Date: 05/03/2024 CLINICAL DATA:  Abdominal pain. EXAM: CT ABDOMEN AND PELVIS WITH CONTRAST TECHNIQUE: Multidetector CT imaging of the abdomen and pelvis was performed using the standard protocol following bolus administration of intravenous contrast. RADIATION DOSE REDUCTION: This exam was performed according to the departmental dose-optimization program which includes automated exposure control, adjustment of the mA and/or kV according to patient size and/or use of iterative reconstruction technique. CONTRAST:  OMNIPAQUE  IOHEXOL  300 MG/ML  SOLN COMPARISON:  CT abdomen  pelvis dated 11/15/2022. FINDINGS: Lower chest: Similar appearance of a 16 mm subpleural nodule in the medial left lower lobe, possibly scarring. The visualized lung bases are otherwise clear. No intra-abdominal free air or free fluid. Hepatobiliary: Small liver cyst. No biliary dilatation. The gallbladder is unremarkable. Pancreas: Unremarkable. No pancreatic ductal dilatation or surrounding inflammatory changes. Spleen: Normal in size without focal abnormality. Adrenals/Urinary Tract: The adrenal glands unremarkable. There is no hydronephrosis on either side. There is symmetric enhancement and excretion of contrast by both kidneys. The visualized ureters appear unremarkable. The urinary bladder is collapsed. Stomach/Bowel: There is large amount of dense stool within the rectal vault suggestive of fecal impaction. There is no bowel obstruction or active inflammation. Small hiatal hernia. Vascular/Lymphatic: Moderate aortoiliac atherosclerotic disease. The IVC is unremarkable. No portal venous gas. No adenopathy. Reproductive: Hysterectomy.  No  suspicious adnexal masses. Other: None Musculoskeletal: Osteopenia with degenerative changes of the spine and extensive thoracolumbar posterior fusion hardware. No acute osseous pathology. Left hip arthroplasty. IMPRESSION: 1. Large amount of dense stool within the rectal vault suggestive of fecal impaction. No bowel obstruction. 2.  Aortic Atherosclerosis (ICD10-I70.0). Electronically Signed   By: Vanetta Chou M.D.   On: 05/03/2024 16:29   EKG: None  Assessment/Plan Principal Problem:   Fecal impaction (HCC)  Assessment and Plan: * Fecal impaction (HCC) Presenting with lower abdominal pain radiating to back and rectum.  Patient is on chronic oxycodone.  And has not been compliant with stool softeners.   - CT shows - Large amount of dense stool within the rectal vault suggestive of fecal impaction. No bowel obstruction. -EDP attempted disimpaction in the ED-a  lot of hard stool, only a small amount could be removed. -Family and patient declined when I offered to try disimpaction again, as they report the first one was very uncomfortable for patient. They would rather wait till morning after another enemas. -Fleet enema, MiraLAX , Senokot -If no bowel movement by a.m., patient may need GI consult - Emphasized need for patient to be compliant with bowel regimen, may benefit from Linzess  or similar   Chronic pain -Hold oxycodone for now  DVT prophylaxis: Lovenox  Code Status: DNR-confirmed with patient, sister and daughter at bedside. Family Communication: Daughter- Ronal is primary decision-maker, sister- Inocente are both at bedside. Disposition Plan: ~ 1-2 days Consults called: None Admission status: Obs Med surg   Author: Tully FORBES Carwin, MD 05/03/2024 7:38 PM  For on call review [email or web address].

## 2024-05-03 NOTE — ED Provider Notes (Signed)
 Belinda [name]   CSN: [id number] Arrival date & time: 05/03/24  1134     Patient presents with: Rectal Pain   Belinda [name] is a 89 y.o. female.  She is very hard of hearing and her daughter is giving most of the history.  Chronic constipation.  No bowel movement in days.  Increased abdominal pain into back and rectum.  No fever nausea vomiting.  Lives at home with daughter.  Daughter states they are having increased difficulty taking care of her at home.  They are interested in nursing home   The history is provided by the patient and a relative.  Abdominal Pain Pain location: lower abd. Pain quality: cramping   Pain severity:  Unable to specify Onset quality:  Unable to specify Progression:  Unchanged Relieved by:  Nothing Worsened by:  Palpation Associated symptoms: constipation   Associated symptoms: no fever, no nausea, no shortness of breath and no vomiting        Prior to Admission medications   Medication Sig Start Date End Date Taking? Authorizing Provider  acetaminophen  (TYLENOL ) 500 MG tablet Take 500 mg by mouth every 6 (six) hours as needed.    [provider]  citalopram (CELEXA) 20 MG tablet Take 1 tablet by mouth every morning. 07/18/20   [provider]  LORazepam  (ATIVAN ) 0.5 MG tablet Take 0.5 mg by mouth See admin instructions. Take 1 tablet in the morning and 1 tablet every evening    [provider]  melatonin 3 MG TABS tablet Take by mouth. 08/06/20   [provider]  oxyCODONE (ROXICODONE) 15 MG immediate release tablet Take 15 mg by mouth every 6 (six) hours as needed for pain. 07/06/20   [provider]  polyethylene glycol powder (GLYCOLAX /MIRALAX ) powder Take 1 capful bid evening for constipation. Patient taking differently: Take 1 capful bid evening for constipation as needed 04/16/14   Harvey Margo CROME, MD    Allergies: Patient has no known  allergies.    Review of Systems  Constitutional:  Negative for fever.  Respiratory:  Negative for shortness of breath.   Gastrointestinal:  Positive for abdominal pain and constipation. Negative for nausea and vomiting.    Updated Vital Signs BP 127/70   Pulse 69   Temp 97.8 F (36.6 C) (Oral)   Resp 17   SpO2 97%   Physical Exam Vitals and nursing [name] reviewed.  Constitutional:      General: She is not in acute distress.    Appearance: Normal appearance. She is well-developed.  HENT:     Head: Normocephalic and atraumatic.  Eyes:     Conjunctiva/sclera: Conjunctivae normal.  Cardiovascular:     Rate and Rhythm: Normal rate and regular rhythm.     Heart sounds: No murmur heard. Pulmonary:     Effort: Pulmonary effort is normal. No respiratory distress.     Breath sounds: Normal breath sounds. No stridor. No wheezing.  Abdominal:     Palpations: Abdomen is soft.     Tenderness: There is abdominal tenderness (mild lower). There is no guarding or rebound.  Genitourinary:    Comments: Rectal exam with nurse Matt as chaperone.  Significant amount of hard tan stool that was difficult to extract.  There is definitely a lot more proximal but I cannot reach and removed. Musculoskeletal:        General: No tenderness or deformity. Normal range of motion.     Cervical  back: Neck supple.  Skin:    General: Skin is warm and dry.  Neurological:     General: No focal deficit present.     Mental Status: She is alert.     GCS: GCS eye subscore is 4. GCS verbal subscore is 5. GCS motor subscore is 6.     Motor: No weakness.     (all labs ordered are listed, but only abnormal results are displayed) Labs Reviewed  COMPREHENSIVE METABOLIC PANEL WITH GFR - Abnormal; Notable for the following components:      Result Value   Sodium 131 (*)    Calcium 7.8 (*)    Albumin 3.2 (*)    All other components within normal limits  CBC WITH DIFFERENTIAL/PLATELET - Abnormal; Notable for the  following components:   RBC 3.48 (*)    Hemoglobin 10.2 (*)    HCT 31.9 (*)    All other components within normal limits  URINALYSIS, ROUTINE W REFLEX MICROSCOPIC - Abnormal; Notable for the following components:   Color, Urine STRAW (*)    Specific Gravity, Urine 1.041 (*)    All other components within normal limits    EKG: None  Radiology: CT ABDOMEN PELVIS W CONTRAST Result Date: 05/03/2024 CLINICAL DATA:  Abdominal pain. EXAM: CT ABDOMEN AND PELVIS WITH CONTRAST TECHNIQUE: Multidetector CT imaging of the abdomen and pelvis was performed using the standard protocol following bolus administration of intravenous contrast. RADIATION DOSE REDUCTION: This exam was performed according to the departmental dose-optimization program which includes automated exposure control, adjustment of the mA and/or kV according to patient size and/or use of iterative reconstruction technique. CONTRAST:  OMNIPAQUE  IOHEXOL  300 MG/ML  SOLN COMPARISON:  CT abdomen pelvis dated 11/15/2022. FINDINGS: Lower chest: Similar appearance of a 16 mm subpleural nodule in the medial left lower lobe, possibly scarring. The visualized lung bases are otherwise clear. No intra-abdominal free air or free fluid. Hepatobiliary: Small liver cyst. No biliary dilatation. The gallbladder is unremarkable. Pancreas: Unremarkable. No pancreatic ductal dilatation or surrounding inflammatory changes. Spleen: Normal in size without focal abnormality. Adrenals/Urinary Tract: The adrenal glands unremarkable. There is no hydronephrosis on either side. There is symmetric enhancement and excretion of contrast by both kidneys. The visualized ureters appear unremarkable. The urinary bladder is collapsed. Stomach/Bowel: There is large amount of dense stool within the rectal vault suggestive of fecal impaction. There is no bowel obstruction or active inflammation. Small hiatal hernia. Vascular/Lymphatic: Moderate aortoiliac atherosclerotic disease. The  IVC is unremarkable. No portal venous gas. No adenopathy. Reproductive: Hysterectomy.  No suspicious adnexal masses. Other: None Musculoskeletal: Osteopenia with degenerative changes of the spine and extensive thoracolumbar posterior fusion hardware. No acute osseous pathology. Left hip arthroplasty. IMPRESSION: 1. Large amount of dense stool within the rectal vault suggestive of fecal impaction. No bowel obstruction. 2.  Aortic Atherosclerosis (ICD10-I70.0). Electronically Signed   By: Vanetta Chou M.D.   On: 05/03/2024 16:29     .Fecal disimpaction  Date/Time: 05/03/2024 5:57 PM  Performed by: Towana Ozell BROCKS, MD Authorized by: Towana Ozell BROCKS, MD  Consent: Verbal consent obtained Consent given by: Daughter. Patient understanding: patient states understanding of the procedure being performed Patient consent: the patient's understanding of the procedure matches consent given Patient identity confirmed: arm band Local anesthesia used: no  Anesthesia: Local anesthesia used: no  Sedation: Patient sedated: no  Patient tolerance: patient tolerated the procedure well with no immediate complications Comments: Minimal results with disimpaction.  Minimal bleeding  Medications Ordered in the ED  iohexol  (OMNIPAQUE ) 300 MG/ML solution 100 mL (100 mLs Intravenous Contrast Given 05/03/24 1520)  fentaNYL  (SUBLIMAZE ) injection 50 mcg (50 mcg Intravenous Given 05/03/24 1610)    Clinical Course as of 05/03/24 1753  Sun May 03, 2024  1409 Rectal disimpaction after discussion with patient's daughter.  She had a lot of hard stool but was only able to remove a small amount.  It was light brown in color.  Will order an enema. [MB]    Clinical Course User Index [MB] Towana Ozell BROCKS, MD                                 Medical Decision Making Amount and/or Complexity of Data Reviewed Labs: ordered. Radiology: ordered.  Risk Prescription drug management. Decision regarding  hospitalization.   This patient complains of constipation and abdominal pain; this involves an extensive number of treatment Options and is a complaint that carries with it a high risk of complications and morbidity. The differential includes constipation, colitis, diverticulitis, perforation, obstruction  I ordered, reviewed and interpreted labs, which included CBC with chronic anemia, chemistries unremarkable, urinalysis without clear signs of infection I ordered medication IV pain medicine and reviewed PMP when indicated. I ordered imaging studies which included CT abdomen and pelvis and I independently    visualized and interpreted imaging which showed moderate stool Additional history obtained from patient's daughter Previous records obtained and reviewed in epic, no recent ED visits Cardiac monitoring reviewed, sinus rhythm Social determinants considered, tobacco use Critical Interventions: None  After the interventions stated above, I reevaluated the patient and found patient still to be uncomfortable and unable to fully evacuate despite enema Admission and further testing considered, she would benefit from mission to the hospital for bowel regiment and family is requesting some social work help      Final diagnoses:  Abdominal pain, unspecified abdominal location  Constipation, unspecified constipation type    ED Discharge Orders     None          Towana Ozell BROCKS, MD 05/03/24 1758

## 2024-05-03 NOTE — ED Triage Notes (Signed)
 Pt arrived via [facility] from home c/o constipation that is chronic in nature and receives enemas often but does not take her stool softener, pt is on a narcotic for pain control. Denies seeing any abnormal blood in stool or bleeding from the rectum, pt does endorse lower abd pain.

## 2024-05-04 DIAGNOSIS — K5641 Fecal impaction: Secondary | ICD-10-CM | POA: Diagnosis not present

## 2024-05-04 MED ORDER — POLYVINYL ALCOHOL 1.4 % OP SOLN
1.0000 [drp] | OPHTHALMIC | Status: DC | PRN
  Administered 2024-05-04: 1 [drp] via OPHTHALMIC
  Filled 2024-05-04: qty 15

## 2024-05-04 MED ORDER — BISACODYL 10 MG RE SUPP
10.0000 mg | Freq: Every day | RECTAL | Status: DC
  Administered 2024-05-05 – 2024-05-06 (×2): 10 mg via RECTAL
  Filled 2024-05-04 (×2): qty 1

## 2024-05-04 MED ORDER — MORPHINE SULFATE (PF) 2 MG/ML IV SOLN
2.0000 mg | INTRAVENOUS | Status: DC | PRN
  Administered 2024-05-04 – 2024-05-05 (×3): 2 mg via INTRAVENOUS
  Filled 2024-05-04 (×3): qty 1

## 2024-05-04 MED ORDER — MINERAL OIL RE ENEM
1.0000 | ENEMA | Freq: Once | RECTAL | Status: AC
  Administered 2024-05-04: 1 via RECTAL

## 2024-05-04 NOTE — Care Management Obs Status (Signed)
 MEDICARE OBSERVATION STATUS NOTIFICATION   Patient Details  Name: Belinda [name] MRN: [id number] Date of Birth: 11-16-1926   Medicare Observation Status Notification Given:  Yes    [name] 05/04/2024, 4:27 PM

## 2024-05-04 NOTE — Plan of Care (Signed)

## 2024-05-04 NOTE — Evaluation (Signed)
 Physical Therapy Evaluation Patient Details Name: Belinda [name] MRN: [id number] DOB: 24-Jan-1927 Today's Date: 05/04/2024  History of Present Illness  Belinda [name] is a 89 y.o. female with medical history significant for chronic back pain, anxiety.  Patient is very hard of hearing, history is obtained with the help of her sister and daughter at bedside.  Patient presented to the ED with complaints of lower abdominal pain rating to the back and rectum started today.  Patient has a history of chronic constipation requiring enemas for bowel movement.  She reports yesterday she used an enema before she was able to have a bowel movement.  Reports it was a good amount.  No vomiting, fevers no chills.  No urinary symptoms.  Patient is on oxycodone  twice a day, she is supposed to be on MiraLAX , but does not take it.   Clinical Impression  Patient demonstrates slow labored movement for sitting up at bedside requiring stand by assist during upright positioning for safety. Patient demonstrates increased difficulty with sitting to stand requiring x2 attempts and physical assistance for complete standing indicating LE weakness. Patient slightly unsteady once in standing with intermittent cues requires to keep hands on walker increasing her risk falls. Patient able to tolerate very short distance side stepping using RW at bedside but is limited by c/o abdominal pain. Patient will benefit from continued skilled physical therapy in hospital and recommended venue below to increase strength, balance, endurance for safe ADLs and gait.        If plan is discharge home, recommend the following: A lot of help with walking and/or transfers;Help with stairs or ramp for entrance;Assist for transportation   Can travel by private vehicle   No    Equipment Recommendations None recommended by PT  Recommendations for Other Services       Functional Status Assessment Patient has had a recent decline in their functional  status and demonstrates the ability to make significant improvements in function in a reasonable and predictable amount of time.     Precautions / Restrictions Precautions Precautions: Fall Recall of Precautions/Restrictions: Impaired Restrictions Weight Bearing Restrictions Per Provider Order: No      Mobility  Bed Mobility Overal bed mobility: Needs Assistance Bed Mobility: Supine to Sit     Supine to sit: Contact guard     General bed mobility comments: labored movement, increased time    Transfers Overall transfer level: Needs assistance Equipment used: Rolling walker (2 wheels) Transfers: Sit to/from Stand, Bed to chair/wheelchair/BSC Sit to Stand: Mod assist   Step pivot transfers: Mod assist, Min assist       General transfer comment: labored movement, increased time; x2 attempt STS secondary to generalized weakness    Ambulation/Gait Ambulation/Gait assistance: Mod assist, Min assist Gait Distance (Feet): 6 Feet Assistive device: Rolling walker (2 wheels)   Gait velocity: Decreased     General Gait Details: Patient limited to sidestepping at bedside secondary to weakness and abdominal pain  Stairs            Wheelchair Mobility     Tilt Bed    Modified Rankin (Stroke Patients Only)       Balance Overall balance assessment: Needs assistance Sitting-balance support: No upper extremity supported, Feet supported Sitting balance-Leahy Scale: Good Sitting balance - Comments: seated EOB     Standing balance-Leahy Scale: Fair Standing balance comment: using RW  Pertinent Vitals/Pain Pain Assessment Pain Assessment: Faces Faces Pain Scale: Hurts even more Pain Location: abdomen Pain Descriptors / Indicators: Cramping, Discomfort, Grimacing Pain Intervention(s): Monitored during session, Limited activity within patient's tolerance    Home Living Family/patient expects to be discharged to:: Private  residence Living Arrangements: Alone Available Help at Discharge: Family;Personal care attendant;Available 24 hours/day Type of Home: House Home Access: Level entry       Home Layout: One level Home Equipment: Rollator (4 wheels);BSC/3in1;Shower seat Additional Comments: per pt report, has 24/7 care    Prior Function Prior Level of Function : Needs assist       Physical Assist : Mobility (physical);ADLs (physical) Mobility (physical): Transfers;Gait;Stairs ADLs (physical): Feeding;Grooming;Bathing;Dressing;Toileting;IADLs Mobility Comments: per pt, family helps with mobility and does not leave her alone ADLs Comments: per pt, family gives her a bath and gets her dressed every morning, able to feed herself     Extremity/Trunk Assessment   Upper Extremity Assessment Upper Extremity Assessment: Defer to OT evaluation    Lower Extremity Assessment Lower Extremity Assessment: Generalized weakness    Cervical / Trunk Assessment Cervical / Trunk Assessment: Kyphotic  Communication   Communication Communication: Impaired Factors Affecting Communication: Hearing impaired    Cognition Arousal: Alert Behavior During Therapy: WFL for tasks assessed/performed   PT - Cognitive impairments: No family/caregiver present to determine baseline                         Following commands: Impaired Following commands impaired: Follows one step commands with increased time     Cueing Cueing Techniques: Verbal cues, Tactile cues     General Comments      Exercises     Assessment/Plan    PT Assessment Patient needs continued PT services  PT Problem List Decreased strength;Decreased mobility;Decreased activity tolerance;Decreased balance       PT Treatment Interventions DME instruction;Therapeutic activities;Therapeutic exercise;Balance training;Functional mobility training    PT Goals (Current goals can be found in the Care Plan section)  Acute Rehab PT  Goals Patient Stated Goal: return home PT Goal Formulation: With patient Time For Goal Achievement: 05/18/24 Potential to Achieve Goals: Good    Frequency Min 3X/week     Co-evaluation               AM-PAC PT 6 Clicks Mobility  Outcome Measure Help needed turning from your back to your side while in a flat bed without using bedrails?: A Little Help needed moving from lying on your back to sitting on the side of a flat bed without using bedrails?: A Little Help needed moving to and from a bed to a chair (including a wheelchair)?: A Lot Help needed standing up from a chair using your arms (e.g., wheelchair or bedside chair)?: A Lot Help needed to walk in hospital room?: A Lot Help needed climbing 3-5 steps with a railing? : A Lot 6 Click Score: 14    End of Session Equipment Utilized During Treatment: Gait belt Activity Tolerance: Patient limited by pain Patient left: in chair;with chair alarm set;with call bell/phone within reach Nurse Communication: Mobility status PT Visit Diagnosis: Unsteadiness on feet (R26.81);Other abnormalities of gait and mobility (R26.89);Muscle weakness (generalized) (M62.81)    Time: 8490-8460 PT Time Calculation (min) (ACUTE ONLY): 30 min   Charges:   PT Evaluation $PT Eval Moderate Complexity: 1 Mod PT Treatments $Therapeutic Activity: 23-37 mins           4:04 PM, 05/04/24,  Joshau Code, SPT

## 2024-05-04 NOTE — Plan of Care (Signed)
  Problem: Acute Rehab PT Goals(only PT should resolve) Goal: Pt Will Go Supine/Side To Sit Outcome: Progressing Flowsheets (Taken 05/04/2024 1605) Pt will go Supine/Side to Sit: with modified independence Goal: Patient Will Transfer Sit To/From Stand Outcome: Progressing Flowsheets (Taken 05/04/2024 1605) Patient will transfer sit to/from stand: with minimal assist Goal: Pt Will Transfer Bed To Chair/Chair To Bed Outcome: Progressing Flowsheets (Taken 05/04/2024 1605) Pt will Transfer Bed to Chair/Chair to Bed: with min assist Goal: Pt Will Ambulate Outcome: Progressing Flowsheets (Taken 05/04/2024 1606) Pt will Ambulate:  with minimal assist  with rolling walker  25 feet

## 2024-05-04 NOTE — Hospital Course (Signed)
 Belinda [name] is a 89 y.o. female with a history of chronic pain and anxiety.  Patient presented secondary to abdominal pain and found to have fecal impaction. Bowel regimen started.

## 2024-05-04 NOTE — Progress Notes (Signed)
 PROGRESS NOTE    Belinda [name]  FMW:[id number] DOB: [birth year] DOA: 05/03/2024 PCP: Renato Dorothey HERO, NP   Brief Narrative: Belinda [name] is a 89 y.o. female with a history of chronic pain and anxiety.  Patient presented secondary to abdominal pain and found to have fecal impaction. Bowel regimen started.   Assessment and Plan:  Fecal impaction Failed manual impaction. CT abdomen/pelvis significant for evidence of fecal impaction within the rectal vault. -Continue enema -Continue MiraLAX   Pelvic pain Possibly related to fecal impaction, but could be related to urinary retention. Patient is having urinary incontinence. -Check bladder scan -Morphine  IV as needed   Chronic pain Patient is on oxycodone  as an outpatient which was held. -Morphine  for acute pain at this time   DVT prophylaxis: Lovenox  Code Status:   Code Status: Limited: Do not attempt resuscitation (DNR) -DNR-LIMITED -Do Not Intubate/DNI  Family Communication: None at bedside Disposition Plan: Discharge home likely in 24 hours pending bowel movement   Consultants:  None  Procedures:  None  Antimicrobials: None    Subjective: Patient reports ongoing pain in her pelvic and rectal areas. No other concerns.  Objective: BP 118/65 (BP Location: Left Arm)   Pulse 85   Temp 98.3 F (36.8 C) (Oral)   Resp 16   Ht 5' 7 (1.702 m)   Wt 67 kg   SpO2 92%   BMI 23.13 kg/m   Examination:  General exam: Appears calm but uncomfortable Respiratory system: Clear to auscultation. Respiratory effort normal. Cardiovascular system: S1 & S2 heard, RRR. Gastrointestinal system: Abdomen is nondistended, soft and nontender. Normal bowel sounds heard. Central nervous system: Alert and oriented. No focal neurological deficits. Psychiatry: Judgement and insight appear normal. Mood & affect appropriate.    Data Reviewed: I have personally reviewed following labs and imaging studies  CBC Lab Results   Component Value Date   WBC 4.2 05/03/2024   RBC 3.48 (L) 05/03/2024   HGB 10.2 (L) 05/03/2024   HCT 31.9 (L) 05/03/2024   MCV 91.7 05/03/2024   MCH 29.3 05/03/2024   PLT 272 05/03/2024   MCHC 32.0 05/03/2024   RDW 14.1 05/03/2024   LYMPHSABS 2.0 05/03/2024   MONOABS 0.4 05/03/2024   EOSABS 0.0 05/03/2024   BASOSABS 0.0 05/03/2024     Last metabolic panel Lab Results  Component Value Date   NA 131 (L) 05/03/2024   K 4.0 05/03/2024   CL 101 05/03/2024   CO2 24 05/03/2024   BUN 11 05/03/2024   CREATININE 0.84 05/03/2024   GLUCOSE 94 05/03/2024   GFRNONAA >60 05/03/2024   GFRAA 58 (L) 05/17/2017   CALCIUM 7.8 (L) 05/03/2024   PROT 6.5 05/03/2024   ALBUMIN 3.2 (L) 05/03/2024   BILITOT 0.3 05/03/2024   ALKPHOS 48 05/03/2024   AST 19 05/03/2024   ALT 8 05/03/2024   ANIONGAP 6 05/03/2024    GFR: Estimated Creatinine Clearance: 39 mL/min (by C-G formula based on SCr of 0.84 mg/dL).  No results found for this or any previous visit (from the past 240 hours).    Radiology Studies: CT ABDOMEN PELVIS W CONTRAST Result Date: 05/03/2024 CLINICAL DATA:  Abdominal pain. EXAM: CT ABDOMEN AND PELVIS WITH CONTRAST TECHNIQUE: Multidetector CT imaging of the abdomen and pelvis was performed using the standard protocol following bolus administration of intravenous contrast. RADIATION DOSE REDUCTION: This exam was performed according to the departmental dose-optimization program which includes automated exposure control, adjustment of the mA and/or kV according to patient size  and/or use of iterative reconstruction technique. CONTRAST:  OMNIPAQUE  IOHEXOL  300 MG/ML  SOLN COMPARISON:  CT abdomen pelvis dated 11/15/2022. FINDINGS: Lower chest: Similar appearance of a 16 mm subpleural nodule in the medial left lower lobe, possibly scarring. The visualized lung bases are otherwise clear. No intra-abdominal free air or free fluid. Hepatobiliary: Small liver cyst. No biliary dilatation. The  gallbladder is unremarkable. Pancreas: Unremarkable. No pancreatic ductal dilatation or surrounding inflammatory changes. Spleen: Normal in size without focal abnormality. Adrenals/Urinary Tract: The adrenal glands unremarkable. There is no hydronephrosis on either side. There is symmetric enhancement and excretion of contrast by both kidneys. The visualized ureters appear unremarkable. The urinary bladder is collapsed. Stomach/Bowel: There is large amount of dense stool within the rectal vault suggestive of fecal impaction. There is no bowel obstruction or active inflammation. Small hiatal hernia. Vascular/Lymphatic: Moderate aortoiliac atherosclerotic disease. The IVC is unremarkable. No portal venous gas. No adenopathy. Reproductive: Hysterectomy.  No suspicious adnexal masses. Other: None Musculoskeletal: Osteopenia with degenerative changes of the spine and extensive thoracolumbar posterior fusion hardware. No acute osseous pathology. Left hip arthroplasty. IMPRESSION: 1. Large amount of dense stool within the rectal vault suggestive of fecal impaction. No bowel obstruction. 2.  Aortic Atherosclerosis (ICD10-I70.0). Electronically Signed   By: Vanetta Chou M.D.   On: 05/03/2024 16:29      LOS: 0 days    Elgin Lam, MD [facility] 05/04/2024, 9:46 AM   If 7PM-7AM, please contact night-coverage [email or web address]

## 2024-05-04 NOTE — TOC Initial Note (Signed)
 Transition of Care [facility]) - Initial/Assessment Note    Patient Details  Name: Belinda [name] MRN: [id number] Date of Birth: 1926-11-12  Transition of [facility] CM/SW Contact:    Noreen KATHEE Cleotilde ISRAEL Phone Number: 05/04/2024, 3:34 PM  Clinical Narrative:                 CSW spoke with daughter Ronal about long-term placement. Unfortunately, patient is under observation and her Medicare would not cover for SN, which was explained to daughter . CSW shared with daughter that a medicaid application would need to be started for long-term placement- daughter understood and stated that she will get one started. However , daughter reports that she has private aides that come in to help patient. CSW informed daughter that [facility]  could be arranged since she will be ready for DC tomorrow until Medicaid application is started for placement outside in community. Daughter understood. ICM will continue to follow.   Expected Discharge Plan: Home w Home Health Services Barriers to Discharge: Continued Medical Work up   Patient Goals and CMS Choice Patient states their goals for this hospitalization and ongoing recovery are:: Long-term placement CMS Medicare.gov Compare Post Acute Care list provided to:: Patient Represenative (must comment) (Daughter Ronal) Choice offered to / list presented to : Adult Children [place] ownership interest in [facility].provided to:: Adult Children    Expected Discharge Plan and Services In-house Referral: Clinical Social Work   Post Acute Care Choice: Horticulturist, commercial (In home care services) Living arrangements for the past 2 months: Single Family Home                                      Prior Living Arrangements/Services Living arrangements for the past 2 months: Single Family Home Lives with:: Adult Children, Self Patient language and need for interpreter reviewed:: Yes Do you feel safe going back to the place where you live?: Yes       Need for Family Participation in Patient Care: Yes (Comment) Care giver support system in place?: Yes (comment) Current home services: Other (comment) (24/7 care) Criminal Activity/Legal Involvement Pertinent to Current Situation/Hospitalization: No - Comment as needed  Activities of Daily Living      Permission Sought/Granted      Share Information with NAME: Ronal     Permission granted to share info w Relationship: Daughter     Emotional Assessment Appearance:: Appears stated age     Orientation: : Oriented to Self Alcohol  / Substance Use: Tobacco Use Psych Involvement: No (comment)  Admission diagnosis:  Fecal impaction (HCC) [K56.41] Abdominal pain, unspecified abdominal location [R10.9] Constipation, unspecified constipation type [K59.00] Patient Active Problem List   Diagnosis Date Noted   Fecal impaction (HCC) 05/03/2024   LLQ pain 06/10/2017   Abnormal weight loss 06/10/2017   Anemia 06/10/2017   Hematochezia 03/23/2014   Constipation 01/13/2014   Abdominal wall bulge secondary to atrophic muscle 12/30/2012   PCP:  Renato Dorothey HERO, NP Pharmacy:   [facility] [phone] - [place], [place] - [address] [place] [address] Phone: [phone] Fax: [phone]     Social Drivers of Health (SDOH) Social History: SDOH Screenings   Food Insecurity: No Food Insecurity (05/03/2024)  Housing: Low Risk  (05/03/2024)  Transportation Needs: No Transportation Needs (05/03/2024)  Utilities: Not At Risk (05/03/2024)  Social Connections: Patient Unable To Answer (05/03/2024)  Tobacco Use: High Risk (11/15/2022)   SDOH Interventions:     Readmission Risk Interventions     No data to display

## 2024-05-05 ENCOUNTER — Observation Stay (HOSPITAL_COMMUNITY)

## 2024-05-05 DIAGNOSIS — K5641 Fecal impaction: Secondary | ICD-10-CM | POA: Diagnosis not present

## 2024-05-05 MED ORDER — OXYCODONE HCL 5 MG PO TABS
5.0000 mg | ORAL_TABLET | Freq: Four times a day (QID) | ORAL | Status: DC | PRN
  Administered 2024-05-05 – 2024-05-06 (×2): 5 mg via ORAL
  Filled 2024-05-05 (×2): qty 1

## 2024-05-05 MED ORDER — MORPHINE SULFATE (PF) 2 MG/ML IV SOLN
2.0000 mg | INTRAVENOUS | Status: DC | PRN
  Administered 2024-05-06: 2 mg via INTRAVENOUS
  Filled 2024-05-05 (×2): qty 1

## 2024-05-05 MED ORDER — ZINC OXIDE 40 % EX OINT
TOPICAL_OINTMENT | CUTANEOUS | Status: DC | PRN
  Filled 2024-05-05: qty 57

## 2024-05-05 MED ORDER — HYDROCODONE-ACETAMINOPHEN 5-325 MG PO TABS: 1.0000 | ORAL_TABLET | Freq: Four times a day (QID) | ORAL | Status: DC | PRN

## 2024-05-05 NOTE — Plan of Care (Signed)
  Problem: Acute Rehab OT Goals (only OT should resolve) Goal: Pt. Will Perform Eating Flowsheets (Taken 05/05/2024 1514) Pt Will Perform Eating: with modified independence Goal: Pt. Will Perform Grooming Flowsheets (Taken 05/05/2024 1514) Pt Will Perform Grooming:  with modified independence  with adaptive equipment Goal: Pt. Will Perform Upper Body Dressing Flowsheets (Taken 05/05/2024 1514) Pt Will Perform Upper Body Dressing:  with contact guard assist  sitting Goal: Pt. Will Transfer To Toilet Flowsheets (Taken 05/05/2024 1514) Pt Will Transfer to Toilet:  with supervision  stand pivot transfer Goal: Pt/Caregiver Will Perform Home Exercise Program Flowsheets (Taken 05/05/2024 1514) Pt/caregiver will Perform Home Exercise Program:  Increased ROM  Increased strength  With minimal assist  Both right and left upper extremity  Rajiv Parlato OT, MOT

## 2024-05-05 NOTE — Plan of Care (Signed)

## 2024-05-05 NOTE — Progress Notes (Addendum)
 PROGRESS NOTE    Belinda [name]  FMW:[id number] DOB: 24-Mar-1927 DOA: 05/03/2024 PCP: Renato Dorothey HERO, NP   Brief Narrative: Belinda [name] is a 89 y.o. female with a history of chronic pain and anxiety.  Patient presented secondary to abdominal pain and found to have fecal impaction. Bowel regimen started with some improvement in impaction.   Assessment and Plan:  Fecal impaction Failed manual impaction. CT abdomen/pelvis significant for evidence of fecal impaction within the rectal vault. Improvement of impaction on abdominal x-ray. -Continue Dulcolax suppositories -Continue MiraLAX   Pelvic pain Possibly related to fecal impaction, but could be related to urinary retention. Patient is having urinary incontinence. Some improvement with ongoing management of impaction. Bladder scan unremarkable. -Oxycodone  and Morphine  IV as needed   Chronic pain Patient is on oxycodone  as an outpatient which was held. -Add oxycodone , reduced dose with increased frequency while inpatient   DVT prophylaxis: Lovenox  Code Status:   Code Status: Limited: Do not attempt resuscitation (DNR) -DNR-LIMITED -Do Not Intubate/DNI  Family Communication: None at bedside. Updated daughter via telephone. Disposition Plan: Discharge home likely in 24 hours pending bowel movement. Patient does not qualify for SNF secondary to Observation status.   Consultants:  None  Procedures:  None  Antimicrobials: None    Subjective: Patient reports ongoing pain in her pelvic and rectal areas. No other concerns.  Objective: BP (!) 145/66   Pulse 88   Temp 97.9 F (36.6 C) (Oral)   Resp 20   Ht 5' 7 (1.702 m)   Wt 67 kg   SpO2 97%   BMI 23.13 kg/m   Examination:  General exam: Appears calm but uncomfortable Respiratory system: Clear to auscultation. Respiratory effort normal. Cardiovascular system: S1 & S2 heard, RRR. Gastrointestinal system: Abdomen is nondistended, soft and nontender. Normal  bowel sounds heard. Central nervous system: Alert.   Data Reviewed: I have personally reviewed following labs and imaging studies  CBC Lab Results  Component Value Date   WBC 4.2 05/03/2024   RBC 3.48 (L) 05/03/2024   HGB 10.2 (L) 05/03/2024   HCT 31.9 (L) 05/03/2024   MCV 91.7 05/03/2024   MCH 29.3 05/03/2024   PLT 272 05/03/2024   MCHC 32.0 05/03/2024   RDW 14.1 05/03/2024   LYMPHSABS 2.0 05/03/2024   MONOABS 0.4 05/03/2024   EOSABS 0.0 05/03/2024   BASOSABS 0.0 05/03/2024     Last metabolic panel Lab Results  Component Value Date   NA 131 (L) 05/03/2024   K 4.0 05/03/2024   CL 101 05/03/2024   CO2 24 05/03/2024   BUN 11 05/03/2024   CREATININE 0.84 05/03/2024   GLUCOSE 94 05/03/2024   GFRNONAA >60 05/03/2024   GFRAA 58 (L) 05/17/2017   CALCIUM 7.8 (L) 05/03/2024   PROT 6.5 05/03/2024   ALBUMIN 3.2 (L) 05/03/2024   BILITOT 0.3 05/03/2024   ALKPHOS 48 05/03/2024   AST 19 05/03/2024   ALT 8 05/03/2024   ANIONGAP 6 05/03/2024    GFR: Estimated Creatinine Clearance: 39 mL/min (by C-G formula based on SCr of 0.84 mg/dL).  No results found for this or any previous visit (from the past 240 hours).    Radiology Studies: DG Abd Portable 1V Result Date: 05/05/2024 CLINICAL DATA:  [id number] Fecal impaction [facility]) [id number] [id number] Abdominal pain [id number] EXAM: PORTABLE ABDOMEN - 1 VIEW COMPARISON:  05/03/2024 FINDINGS: Nonobstructive bowel gas pattern.Small volume fecal loading in the rectum.No pneumoperitoneum. No organomegaly or radiopaque calculi. No acute fracture or destructive lesion. Elevation  of the right hemidiaphragm. No pleural effusion.Thoracolumbar and sacral fusion hardware noted. Left hip arthroplasty. IMPRESSION: Nonobstructive bowel gas pattern. Small volume fecal loading within the rectum. Electronically Signed   By: Rogelia Myers M.D.   On: 05/05/2024 12:14   CT ABDOMEN PELVIS W CONTRAST Result Date: 05/03/2024 CLINICAL DATA:  Abdominal pain. EXAM: CT ABDOMEN  AND PELVIS WITH CONTRAST TECHNIQUE: Multidetector CT imaging of the abdomen and pelvis was performed using the standard protocol following bolus administration of intravenous contrast. RADIATION DOSE REDUCTION: This exam was performed according to the departmental dose-optimization program which includes automated exposure control, adjustment of the mA and/or kV according to patient size and/or use of iterative reconstruction technique. CONTRAST:  OMNIPAQUE  IOHEXOL  300 MG/ML  SOLN COMPARISON:  CT abdomen pelvis dated 11/15/2022. FINDINGS: Lower chest: Similar appearance of a 16 mm subpleural nodule in the medial left lower lobe, possibly scarring. The visualized lung bases are otherwise clear. No intra-abdominal free air or free fluid. Hepatobiliary: Small liver cyst. No biliary dilatation. The gallbladder is unremarkable. Pancreas: Unremarkable. No pancreatic ductal dilatation or surrounding inflammatory changes. Spleen: Normal in size without focal abnormality. Adrenals/Urinary Tract: The adrenal glands unremarkable. There is no hydronephrosis on either side. There is symmetric enhancement and excretion of contrast by both kidneys. The visualized ureters appear unremarkable. The urinary bladder is collapsed. Stomach/Bowel: There is large amount of dense stool within the rectal vault suggestive of fecal impaction. There is no bowel obstruction or active inflammation. Small hiatal hernia. Vascular/Lymphatic: Moderate aortoiliac atherosclerotic disease. The IVC is unremarkable. No portal venous gas. No adenopathy. Reproductive: Hysterectomy.  No suspicious adnexal masses. Other: None Musculoskeletal: Osteopenia with degenerative changes of the spine and extensive thoracolumbar posterior fusion hardware. No acute osseous pathology. Left hip arthroplasty. IMPRESSION: 1. Large amount of dense stool within the rectal vault suggestive of fecal impaction. No bowel obstruction. 2.  Aortic Atherosclerosis  (ICD10-I70.0). Electronically Signed   By: Vanetta Chou M.D.   On: 05/03/2024 16:29      LOS: 0 days    Elgin Lam, MD [facility] 05/05/2024, 2:22 PM   If 7PM-7AM, please contact night-coverage [email or web address]

## 2024-05-05 NOTE — Evaluation (Signed)
 Occupational Therapy Evaluation Patient Details Name: Belinda [name] MRN: [id number] DOB: 03-02-1927 Today's Date: 05/05/2024   History of Present Illness   Belinda [name] is a 89 y.o. female with medical history significant for chronic back pain, anxiety.  Patient is very hard of hearing, history is obtained with the help of her sister and daughter at bedside.  Patient presented to the ED with complaints of lower abdominal pain rating to the back and rectum started today.  Patient has a history of chronic constipation requiring enemas for bowel movement.  She reports yesterday she used an enema before she was able to have a bowel movement.  Reports it was a good amount.  No vomiting, fevers no chills.  No urinary symptoms.  Patient is on oxycodone  twice a day, she is supposed to be on MiraLAX , but does not take it.     Clinical Impressions Pt agreeable to OT evaluation. Family reports pt is not doing as well as she does at baseline. Pt required min A for bed mobility and min to mod A for sit to stand and a couple steps near EOB with RW. B UE limited in A/ROM and P/ROM. Pt assisted for most ADL's at baseline but has been able to feed herself in the past. Pt left in the bed with call bell within reach and bed alarm set. Pt will benefit from continued OT in the hospital to increase strength, balance, and endurance for safe ADL's.        If plan is discharge home, recommend the following:   A lot of help with bathing/dressing/bathroom;A little help with walking and/or transfers;Assistance with cooking/housework;Assistance with feeding;Assist for transportation;Help with stairs or ramp for entrance     Functional Status Assessment   Patient has had a recent decline in their functional status and demonstrates the ability to make significant improvements in function in a reasonable and predictable amount of time.     Equipment Recommendations   None recommended by OT              Precautions/Restrictions   Precautions Precautions: Fall Recall of Precautions/Restrictions: Impaired Restrictions Weight Bearing Restrictions Per Provider Order: No     Mobility Bed Mobility Overal bed mobility: Needs Assistance Bed Mobility: Supine to Sit, Sit to Supine     Supine to sit: Min assist Sit to supine: Contact guard assist   General bed mobility comments: labored movement; single hand held assist.    Transfers Overall transfer level: Needs assistance Equipment used: Rolling walker (2 wheels) Transfers: Sit to/from Stand Sit to Stand: Min assist, Mod assist           General transfer comment: Min to mod A for sit to stand with RW follwed by a couple steps.      Balance Overall balance assessment: Needs assistance Sitting-balance support: No upper extremity supported, Feet supported Sitting balance-Leahy Scale: Fair Sitting balance - Comments: seated at EOB   Standing balance support: Bilateral upper extremity supported, During functional activity, Reliant on assistive device for balance Standing balance-Leahy Scale: Fair Standing balance comment: poor to fair using RW                           ADL either performed or assessed with clinical judgement   ADL Overall ADL's : Needs assistance/impaired Eating/Feeding: Minimal assistance;Moderate assistance;Sitting   Grooming: Moderate assistance;Sitting;Maximal assistance   Upper Body Bathing: Moderate assistance;Sitting   Lower Body Bathing: Maximal assistance;Sitting/lateral  leans   Upper Body Dressing : Moderate assistance;Sitting   Lower Body Dressing: Maximal assistance;Sitting/lateral leans   Toilet Transfer: Moderate assistance;Stand-pivot;Rolling walker (2 wheels) Toilet Transfer Details (indicate cue type and reason): Partially simulated via sit to stand and a few steps near bed with RW. Toileting- Clothing Manipulation and Hygiene: Maximal assistance;Total assistance;Bed  level;Sitting/lateral lean       Functional mobility during ADLs: Moderate assistance;Rolling walker (2 wheels) General ADL Comments: Pt took a couple steps forward and backward with RW.     Vision Baseline Vision/History: 1 Wears glasses Ability to See in Adequate Light: 2 Moderately impaired Patient Visual Report: No change from baseline Vision Assessment?:  (Poor sight at baseline per family's report.)     Perception Perception: Not tested       Praxis Praxis: Not tested       Pertinent Vitals/Pain Pain Assessment Pain Assessment: Faces Faces Pain Scale: Hurts a little bit Pain Location: shoulders with light P/ROM Pain Descriptors / Indicators: Grimacing Pain Intervention(s): Monitored during session, Repositioned, Limited activity within patient's tolerance     Extremity/Trunk Assessment Upper Extremity Assessment Upper Extremity Assessment: Generalized weakness (2+/5 shoulder flexion; A/ROM limited to around 50 % of available range. P/ROm lmited to ~75% of available range for shoulder flexion; generally weak otherwise. Weak grasp with noted difficulty flexing at pip of 3rd and 4th digits of R hand.)   Lower Extremity Assessment Lower Extremity Assessment: Defer to PT evaluation   Cervical / Trunk Assessment Cervical / Trunk Assessment: Kyphotic   Communication Communication Communication: Impaired Factors Affecting Communication: Hearing impaired   Cognition Arousal: Alert Behavior During Therapy: WFL for tasks assessed/performed Cognition: No apparent impairments                               Following commands: Intact       Cueing  General Comments   Cueing Techniques: Verbal cues;Tactile cues                 Home Living Family/patient expects to be discharged to:: Private residence Living Arrangements: Alone Available Help at Discharge: Family;Personal care attendant;Available 24 hours/day Type of Home: House Home Access: Level  entry     Home Layout: One level     Bathroom Shower/Tub: Producer, television/film/video: Standard     Home Equipment: Rollator (4 wheels);BSC/3in1;Shower seat   Additional Comments: Family present and reporting they care for her but state that she is not usually needing as much assist as she is currently.      Prior Functioning/Environment Prior Level of Function : Needs assist       Physical Assist : Mobility (physical);ADLs (physical) Mobility (physical): Bed mobility;Transfers;Gait;Stairs ADLs (physical): Grooming;Bathing;Dressing;Toileting;IADLs Mobility Comments: Family reports pt always has at least supervision assist during mobility and at times needs physical assist to stand with use of the RW. ADLs Comments: Assist for everything but feeding typically, but recently pt has needed assist with feeding.    OT Problem List: Decreased strength;Decreased activity tolerance;Decreased range of motion;Impaired balance (sitting and/or standing)   OT Treatment/Interventions: Self-care/ADL training;Therapeutic exercise;Therapeutic activities;Patient/family education;Balance training;DME and/or AE instruction      OT Goals(Current goals can be found in the care plan section)   Acute Rehab OT Goals Patient Stated Goal: Improve function. OT Goal Formulation: With patient/family Time For Goal Achievement: 05/19/24 Potential to Achieve Goals: Fair   OT Frequency:  Min 3X/week  AM-PAC OT 6 Clicks Daily Activity     Outcome Measure Help from another person eating meals?: A Little Help from another person taking care of personal grooming?: A Lot Help from another person toileting, which includes using toliet, bedpan, or urinal?: A Lot Help from another person bathing (including washing, rinsing, drying)?: A Lot Help from another person to put on and taking off regular upper body clothing?: A Lot Help from another person to put on and taking off  regular lower body clothing?: A Lot 6 Click Score: 13   End of Session Equipment Utilized During Treatment: Rolling walker (2 wheels);Gait belt  Activity Tolerance: Patient tolerated treatment well Patient left: with call bell/phone within reach;in bed;with bed alarm set;with family/visitor present  OT Visit Diagnosis: Unsteadiness on feet (R26.81);Other abnormalities of gait and mobility (R26.89);Muscle weakness (generalized) (M62.81)                Time: 8673-8661 OT Time Calculation (min): 12 min Charges:  OT General Charges $OT Visit: 1 Visit OT Evaluation $OT Eval Low Complexity: 1 Low  Nara Paternoster OT, MOT  Jayson Person 05/05/2024, 3:12 PM

## 2024-05-05 NOTE — Progress Notes (Signed)
 Mobility Specialist Progress Note:    05/05/24 1040  Mobility  Activity Pivoted/transferred to/from [facility]  Level of Assistance Minimal assist, patient does 75% or more  Assistive Device Front wheel walker  Distance Ambulated (ft) 3 ft  Range of Motion/Exercises Active;All extremities  Activity Response Tolerated well  Mobility Referral Yes  Mobility visit 1 Mobility  Mobility Specialist Start Time (ACUTE ONLY) 1040  Mobility Specialist Stop Time (ACUTE ONLY) 1100  Mobility Specialist Time Calculation (min) (ACUTE ONLY) 20 min   Pt received in bed, RN requesting assistance transferring to [facility]. Required MinA with RW to stand and transfer. Tolerated well, RN and family in room. All needs met.  Winthrop Shannahan Mobility Specialist Please contact via Special educational needs teacher or  Rehab office at [phone]

## 2024-05-06 DIAGNOSIS — K5641 Fecal impaction: Secondary | ICD-10-CM | POA: Diagnosis not present

## 2024-05-06 MED ORDER — POLYETHYLENE GLYCOL 3350 17 G PO PACK: 17.0000 g | PACK | Freq: Two times a day (BID) | ORAL | 0 refills | Status: AC

## 2024-05-06 MED ORDER — BISACODYL 10 MG RE SUPP: 10.0000 mg | RECTAL | 0 refills | Status: AC | PRN

## 2024-05-06 MED ORDER — SENNOSIDES-DOCUSATE SODIUM 8.6-50 MG PO TABS: 1.0000 | ORAL_TABLET | Freq: Two times a day (BID) | ORAL | 0 refills | Status: AC

## 2024-05-06 NOTE — TOC Transition Note (Signed)
 Transition of Care [facility]) - Discharge Note   Patient Details  Name: Belinda [name] MRN: [id number] Date of Birth: January 21, 1927  [facility] CM/SW Contact:  Hoy DELENA Bigness, LCSW Phone Number: 05/06/2024, 9:34 AM   Clinical Narrative:    Spoke with pt's daughter and confirmed plan for pt to return home with home health services. Pt's daughter does not have agency preference. HHPT/OT has been arranged with [facility]. HH order in place. Pt's daughter plans to provide transportation home for pt.    Final next level of care: Home w Home Health Services Barriers to Discharge: Barriers Resolved   Patient Goals and CMS Choice Patient states their goals for this hospitalization and ongoing recovery are:: Long-term placement CMS Medicare.gov Compare Post Acute Care list provided to:: Patient Represenative (must comment) (Daughter Ronal) Choice offered to / list presented to : Adult Children [place] ownership interest in [facility].provided to:: Adult Children    Discharge Placement                       Discharge Plan and Services Additional resources added to the After Visit Summary for   In-house Referral: Clinical Social Work   Post Acute Care Choice: Durable Medical Equipment (In home care services)          DME Arranged: N/A DME Agency: NA       HH Arranged: PT, OT HH Agency: [facility] Date HH Agency Contacted: 05/06/24 Time HH Agency Contacted: [phone] Representative spoke with at [facility] Agency: Camie  Social Drivers of Health (SDOH) Interventions SDOH Screenings   Food Insecurity: No Food Insecurity (05/03/2024)  Housing: Low Risk  (05/03/2024)  Transportation Needs: No Transportation Needs (05/03/2024)  Utilities: Not At Risk (05/03/2024)  Social Connections: Patient Unable To Answer (05/03/2024)  Tobacco Use: High Risk (11/15/2022)     Readmission Risk Interventions     No data to display

## 2024-05-06 NOTE — Discharge Summary (Signed)
 Physician Discharge Summary  Belinda [name] FMW:[id number] DOB: 1927-08-01 DOA: 05/03/2024  PCP: Renato Dorothey HERO, NP  Admit date: 05/03/2024 Discharge date: 05/06/2024  Admitted From: Home Disposition: Home  Recommendations for Outpatient Follow-up:  Follow up with PCP in 1 week  Follow up in ED if symptoms worsen or new appear   Home Health: PT/OT Equipment/Devices: None  Discharge Condition: Stable CODE STATUS: DNR Diet recommendation: Regular  Brief/Interim Summary: 89 y.o. female with a history of chronic pain and anxiety presented with worsening abdominal pain and was found to have fecal impaction.  She was started on bowel regimen with improvement.  Her condition is currently stable.  She will be discharged home today with home health PT/OT.  Discharge Diagnoses:   Fecal impaction -Failed manual impaction. CT abdomen/pelvis significant for evidence of fecal impaction within the rectal vault. Improvement of impaction on abdominal x-ray.  - Currently having bowel movements.  Continue Senokot and MiraLAX  scheduled.  Continue Dulcolax suppository as needed.  Discharge patient home today.  Pelvic pain - Possibly from fecal impaction.  Improving.  Continue current pain management  Chronic pain with opiate dependence -continue home regimen.  Outpatient follow-up with PCP/pain management  Anxiety -Continue home regimen  Hyponatremia - No recent labs  Anemia of chronic disease - From chronic illnesses.  Outpatient follow-up.   Discharge Instructions  Discharge Instructions     Diet general   Complete by: As directed    Increase activity slowly   Complete by: As directed       Allergies as of 05/06/2024   No Known Allergies      Medication List     TAKE these medications    acetaminophen  500 MG tablet Commonly known as: TYLENOL  Take 500 mg by mouth every 6 (six) hours as needed.   bisacodyl  10 MG suppository Commonly known as: DULCOLAX Place 1  suppository (10 mg total) rectally as needed for moderate constipation.   citalopram 20 MG tablet Commonly known as: CELEXA Take 1 tablet by mouth every morning.   LORazepam  0.5 MG tablet Commonly known as: ATIVAN  Take 0.5 mg by mouth 2 (two) times daily.   melatonin 3 MG Tabs tablet Take 3 mg by mouth at bedtime.   Oxycodone  HCl 10 MG Tabs Take 1 tablet by mouth in the morning and at bedtime.   polyethylene glycol 17 g packet Commonly known as: MIRALAX  / GLYCOLAX  Take 17 g by mouth 2 (two) times daily.   senna-docusate 8.6-50 MG tablet Commonly known as: Senokot-S Take 1 tablet by mouth 2 (two) times daily.        Follow-up Information     [facility] [place], [place] Follow up.   Why: [facility] will follow up with you at discharge to provide home health services Contact information: [address] [place] [address] [phone]         Renato Dorothey HERO, NP. Schedule an appointment as soon as possible for a visit in 1 week(s).   Specialty: Internal Medicine Contact information: [address] [place] [address] [phone]                No Known Allergies  Consultations: None   Procedures/Studies: DG Abd Portable 1V Result Date: 05/05/2024 CLINICAL DATA:  [id number] Fecal impaction [facility]) [id number] Abdominal pain [id number] EXAM: PORTABLE ABDOMEN - 1 VIEW COMPARISON:  05/03/2024 FINDINGS: Nonobstructive bowel gas pattern.Small volume fecal loading in the rectum.No pneumoperitoneum. No  organomegaly or radiopaque calculi. No acute fracture or destructive lesion. Elevation of the right hemidiaphragm. No pleural effusion.Thoracolumbar and sacral fusion hardware noted. Left hip arthroplasty. IMPRESSION: Nonobstructive bowel gas pattern. Small volume fecal loading within the rectum. Electronically Signed   By: Rogelia Myers M.D.   On: 05/05/2024 12:14   CT ABDOMEN PELVIS W CONTRAST Result Date: 05/03/2024 CLINICAL  DATA:  Abdominal pain. EXAM: CT ABDOMEN AND PELVIS WITH CONTRAST TECHNIQUE: Multidetector CT imaging of the abdomen and pelvis was performed using the standard protocol following bolus administration of intravenous contrast. RADIATION DOSE REDUCTION: This exam was performed according to the departmental dose-optimization program which includes automated exposure control, adjustment of the mA and/or kV according to patient size and/or use of iterative reconstruction technique. CONTRAST:  OMNIPAQUE  IOHEXOL  300 MG/ML  SOLN COMPARISON:  CT abdomen pelvis dated 11/15/2022. FINDINGS: Lower chest: Similar appearance of a 16 mm subpleural nodule in the medial left lower lobe, possibly scarring. The visualized lung bases are otherwise clear. No intra-abdominal free air or free fluid. Hepatobiliary: Small liver cyst. No biliary dilatation. The gallbladder is unremarkable. Pancreas: Unremarkable. No pancreatic ductal dilatation or surrounding inflammatory changes. Spleen: Normal in size without focal abnormality. Adrenals/Urinary Tract: The adrenal glands unremarkable. There is no hydronephrosis on either side. There is symmetric enhancement and excretion of contrast by both kidneys. The visualized ureters appear unremarkable. The urinary bladder is collapsed. Stomach/Bowel: There is large amount of dense stool within the rectal vault suggestive of fecal impaction. There is no bowel obstruction or active inflammation. Small hiatal hernia. Vascular/Lymphatic: Moderate aortoiliac atherosclerotic disease. The IVC is unremarkable. No portal venous gas. No adenopathy. Reproductive: Hysterectomy.  No suspicious adnexal masses. Other: None Musculoskeletal: Osteopenia with degenerative changes of the spine and extensive thoracolumbar posterior fusion hardware. No acute osseous pathology. Left hip arthroplasty. IMPRESSION: 1. Large amount of dense stool within the rectal vault suggestive of fecal impaction. No bowel obstruction.  2.  Aortic Atherosclerosis (ICD10-I70.0). Electronically Signed   By: Vanetta Chou M.D.   On: 05/03/2024 16:29      Subjective: Patient seen and examined at bedside.  Complains of headache.  Had bowel movements yesterday as per nursing staff.  No fever or vomiting reported.  Discharge Exam: Vitals:   05/05/24 1249 05/05/24 2131  BP: (!) 145/66 134/75  Pulse: 88 64  Resp: 20 16  Temp: 97.9 F (36.6 C) 98.4 F (36.9 C)  SpO2: 97% 95%    General: Pt is alert, awake, not in acute distress.  Elderly female lying in bed.  On room air.  Looks chronically ill and deconditioned. Cardiovascular: rate controlled, S1/S2 + Respiratory: bilateral decreased breath sounds at bases with scattered crackles Abdominal: Soft, NT, ND, bowel sounds + Extremities: no edema, no cyanosis    The results of significant diagnostics from this hospitalization (including imaging, microbiology, ancillary and laboratory) are listed below for reference.     Microbiology: No results found for this or any previous visit (from the past 240 hours).   Labs: BNP (last 3 results) No results for input(s): BNP in the last 8760 hours. Basic Metabolic Panel: Recent Labs  Lab 05/03/24 1342  NA 131*  K 4.0  CL 101  CO2 24  GLUCOSE 94  BUN 11  CREATININE 0.84  CALCIUM 7.8*   Liver Function Tests: Recent Labs  Lab 05/03/24 1342  AST 19  ALT 8  ALKPHOS 48  BILITOT 0.3  PROT 6.5  ALBUMIN 3.2*   No results for input(s):  LIPASE, AMYLASE in the last 168 hours. No results for input(s): AMMONIA in the last 168 hours. CBC: Recent Labs  Lab 05/03/24 1342  WBC 4.2  NEUTROABS 1.7  HGB 10.2*  HCT 31.9*  MCV 91.7  PLT 272   Cardiac Enzymes: No results for input(s): CKTOTAL, CKMB, CKMBINDEX, TROPONINI in the last 168 hours. BNP: Invalid input(s): POCBNP CBG: No results for input(s): GLUCAP in the last 168 hours. D-Dimer No results for input(s): DDIMER in the last 72  hours. Hgb A1c No results for input(s): HGBA1C in the last 72 hours. Lipid Profile No results for input(s): CHOL, HDL, LDLCALC, TRIG, CHOLHDL, LDLDIRECT in the last 72 hours. Thyroid function studies No results for input(s): TSH, T4TOTAL, T3FREE, THYROIDAB in the last 72 hours.  Invalid input(s): FREET3 Anemia work up No results for input(s): VITAMINB12, FOLATE, FERRITIN, TIBC, IRON, RETICCTPCT in the last 72 hours. Urinalysis    Component Value Date/Time   COLORURINE STRAW (A) 05/03/2024 1343   APPEARANCEUR CLEAR 05/03/2024 1343   LABSPEC 1.041 (H) 05/03/2024 1343   PHURINE 6.0 05/03/2024 1343   GLUCOSEU NEGATIVE 05/03/2024 1343   HGBUR NEGATIVE 05/03/2024 1343   BILIRUBINUR NEGATIVE 05/03/2024 1343   KETONESUR NEGATIVE 05/03/2024 1343   PROTEINUR NEGATIVE 05/03/2024 1343   NITRITE NEGATIVE 05/03/2024 1343   LEUKOCYTESUR NEGATIVE 05/03/2024 1343   Sepsis Labs Recent Labs  Lab 05/03/24 1342  WBC 4.2   Microbiology No results found for this or any previous visit (from the past 240 hours).   Time coordinating discharge: 35 minutes  SIGNED:   Sophie Mao, MD  [facility] 05/06/2024, 9:42 AM

## 2024-05-06 NOTE — Plan of Care (Signed)
